# Patient Record
Sex: Female | Born: 1940 | Race: White | Hispanic: No | Marital: Single
Health system: Southern US, Community
[De-identification: ages and names within clinical notes are randomized; demographics above are authoritative.]

## PROBLEM LIST (undated history)

## (undated) DIAGNOSIS — F429 Obsessive-compulsive disorder, unspecified: Secondary | ICD-10-CM

## (undated) DIAGNOSIS — K5909 Other constipation: Secondary | ICD-10-CM

## (undated) DIAGNOSIS — I10 Essential (primary) hypertension: Secondary | ICD-10-CM

## (undated) DIAGNOSIS — E785 Hyperlipidemia, unspecified: Secondary | ICD-10-CM

## (undated) DIAGNOSIS — I509 Heart failure, unspecified: Secondary | ICD-10-CM

## (undated) DIAGNOSIS — E079 Disorder of thyroid, unspecified: Secondary | ICD-10-CM

## (undated) DIAGNOSIS — F419 Anxiety disorder, unspecified: Secondary | ICD-10-CM

## (undated) HISTORY — DX: Disorder of thyroid, unspecified: E07.9

## (undated) HISTORY — DX: Anxiety disorder, unspecified: F41.9

## (undated) HISTORY — DX: Hyperlipidemia, unspecified: E78.5

## (undated) HISTORY — DX: Heart failure, unspecified: I50.9

## (undated) HISTORY — DX: Essential (primary) hypertension: I10

## (undated) HISTORY — DX: Other constipation: K59.09

## (undated) HISTORY — DX: Obsessive-compulsive disorder, unspecified: F42.9

---

## 2004-04-17 ENCOUNTER — Other Ambulatory Visit: Payer: Self-pay

## 2004-04-17 ENCOUNTER — Emergency Department: Payer: Self-pay | Admitting: Emergency Medicine

## 2006-07-14 ENCOUNTER — Ambulatory Visit: Payer: Self-pay | Admitting: Specialist

## 2006-11-24 ENCOUNTER — Other Ambulatory Visit: Payer: Self-pay

## 2006-11-24 ENCOUNTER — Emergency Department: Payer: Self-pay | Admitting: Emergency Medicine

## 2007-08-15 ENCOUNTER — Observation Stay: Payer: Self-pay | Admitting: Cardiology

## 2008-09-24 ENCOUNTER — Inpatient Hospital Stay: Payer: Self-pay | Admitting: Internal Medicine

## 2009-01-08 ENCOUNTER — Inpatient Hospital Stay: Payer: Self-pay | Admitting: Internal Medicine

## 2009-02-14 ENCOUNTER — Inpatient Hospital Stay: Payer: Self-pay | Admitting: Surgery

## 2009-05-08 ENCOUNTER — Ambulatory Visit: Payer: Self-pay | Admitting: Gastroenterology

## 2009-12-01 ENCOUNTER — Ambulatory Visit: Payer: Self-pay | Admitting: Internal Medicine

## 2009-12-08 ENCOUNTER — Ambulatory Visit: Payer: Self-pay | Admitting: Internal Medicine

## 2010-01-01 ENCOUNTER — Ambulatory Visit: Payer: Self-pay | Admitting: Internal Medicine

## 2010-01-31 ENCOUNTER — Ambulatory Visit: Payer: Self-pay | Admitting: Internal Medicine

## 2010-03-03 ENCOUNTER — Ambulatory Visit: Payer: Self-pay | Admitting: Internal Medicine

## 2010-04-02 ENCOUNTER — Ambulatory Visit: Payer: Self-pay | Admitting: Internal Medicine

## 2010-05-03 ENCOUNTER — Ambulatory Visit: Payer: Self-pay | Admitting: Internal Medicine

## 2010-05-20 ENCOUNTER — Emergency Department: Payer: Self-pay | Admitting: Emergency Medicine

## 2010-06-03 ENCOUNTER — Ambulatory Visit: Payer: Self-pay | Admitting: Internal Medicine

## 2010-07-07 ENCOUNTER — Ambulatory Visit: Payer: Self-pay | Admitting: Internal Medicine

## 2010-08-02 ENCOUNTER — Ambulatory Visit: Payer: Self-pay | Admitting: Internal Medicine

## 2010-09-01 ENCOUNTER — Ambulatory Visit: Payer: Self-pay | Admitting: Internal Medicine

## 2010-10-02 ENCOUNTER — Ambulatory Visit: Payer: Self-pay | Admitting: Internal Medicine

## 2010-11-01 ENCOUNTER — Ambulatory Visit: Payer: Self-pay | Admitting: Internal Medicine

## 2010-12-02 ENCOUNTER — Ambulatory Visit: Payer: Self-pay | Admitting: Internal Medicine

## 2011-01-02 ENCOUNTER — Ambulatory Visit: Payer: Self-pay | Admitting: Internal Medicine

## 2011-01-29 ENCOUNTER — Emergency Department: Payer: Self-pay | Admitting: Emergency Medicine

## 2011-02-08 ENCOUNTER — Emergency Department: Payer: Self-pay | Admitting: Emergency Medicine

## 2011-02-16 ENCOUNTER — Ambulatory Visit: Payer: Self-pay | Admitting: Internal Medicine

## 2011-03-04 ENCOUNTER — Ambulatory Visit: Payer: Self-pay | Admitting: Internal Medicine

## 2011-04-01 ENCOUNTER — Emergency Department: Payer: Self-pay | Admitting: Internal Medicine

## 2011-05-12 ENCOUNTER — Ambulatory Visit: Payer: Self-pay | Admitting: Internal Medicine

## 2011-06-04 ENCOUNTER — Ambulatory Visit: Payer: Self-pay | Admitting: Internal Medicine

## 2011-06-09 LAB — IRON AND TIBC
Iron Bind.Cap.(Total): 367 ug/dL (ref 250–450)
Iron: 54 ug/dL (ref 50–170)
Unbound Iron-Bind.Cap.: 313 ug/dL

## 2011-06-09 LAB — CANCER CENTER HEMOGLOBIN: HGB: 12.1 g/dL (ref 12.0–16.0)

## 2011-07-02 ENCOUNTER — Ambulatory Visit: Payer: Self-pay | Admitting: Internal Medicine

## 2012-05-22 ENCOUNTER — Emergency Department: Payer: Self-pay | Admitting: Emergency Medicine

## 2012-05-22 LAB — URINALYSIS, COMPLETE
Bilirubin,UR: NEGATIVE
Blood: NEGATIVE
Glucose,UR: NEGATIVE mg/dL (ref 0–75)
Nitrite: POSITIVE
Ph: 5 (ref 4.5–8.0)
Protein: NEGATIVE
Squamous Epithelial: 3
WBC UR: 23 /HPF (ref 0–5)

## 2012-05-23 LAB — CBC
HGB: 11 g/dL — ABNORMAL LOW (ref 12.0–16.0)
MCH: 31.3 pg (ref 26.0–34.0)
MCHC: 33.2 g/dL (ref 32.0–36.0)
MCV: 94 fL (ref 80–100)
Platelet: 227 10*3/uL (ref 150–440)
RBC: 3.52 10*6/uL — ABNORMAL LOW (ref 3.80–5.20)
RDW: 14.4 % (ref 11.5–14.5)
WBC: 7.4 10*3/uL (ref 3.6–11.0)

## 2012-05-23 LAB — COMPREHENSIVE METABOLIC PANEL
Albumin: 3.6 g/dL (ref 3.4–5.0)
Alkaline Phosphatase: 107 U/L (ref 50–136)
Anion Gap: 7 (ref 7–16)
BUN: 16 mg/dL (ref 7–18)
Calcium, Total: 8.8 mg/dL (ref 8.5–10.1)
Chloride: 108 mmol/L — ABNORMAL HIGH (ref 98–107)
Creatinine: 1.06 mg/dL (ref 0.60–1.30)
EGFR (African American): >60
EGFR (Non-African Amer.): 52 — ABNORMAL LOW
Glucose: 91 mg/dL (ref 65–99)
Osmolality: 284 (ref 275–301)
Total Protein: 7 g/dL (ref 6.4–8.2)

## 2012-05-23 LAB — TROPONIN I: Troponin-I: 0.02 ng/mL

## 2012-05-23 LAB — LIPASE, BLOOD: Lipase: 193 U/L (ref 73–393)

## 2012-05-25 LAB — URINE CULTURE

## 2012-10-01 ENCOUNTER — Emergency Department: Payer: Self-pay | Admitting: Internal Medicine

## 2012-10-01 LAB — COMPREHENSIVE METABOLIC PANEL
Alkaline Phosphatase: 105 U/L (ref 50–136)
BUN: 25 mg/dL — ABNORMAL HIGH (ref 7–18)
Chloride: 109 mmol/L — ABNORMAL HIGH (ref 98–107)
Co2: 22 mmol/L (ref 21–32)
EGFR (African American): 35 — ABNORMAL LOW
EGFR (Non-African Amer.): 30 — ABNORMAL LOW
Glucose: 69 mg/dL (ref 65–99)
Osmolality: 280 (ref 275–301)
Potassium: 4 mmol/L (ref 3.5–5.1)
SGOT(AST): 30 U/L (ref 15–37)
SGPT (ALT): 18 U/L (ref 12–78)
Sodium: 139 mmol/L (ref 136–145)
Total Protein: 7.3 g/dL (ref 6.4–8.2)

## 2012-10-01 LAB — CBC WITH DIFFERENTIAL/PLATELET
Basophil #: 0 10*3/uL (ref 0.0–0.1)
Basophil %: 0.4 %
Eosinophil #: 0.1 10*3/uL (ref 0.0–0.7)
Eosinophil %: 1 %
HGB: 11.4 g/dL — ABNORMAL LOW (ref 12.0–16.0)
Lymphocyte #: 1.1 10*3/uL (ref 1.0–3.6)
Lymphocyte %: 11.8 %
MCV: 96 fL (ref 80–100)
Monocyte #: 0.5 x10 3/mm (ref 0.2–0.9)
Neutrophil #: 7.7 10*3/uL — ABNORMAL HIGH (ref 1.4–6.5)
Neutrophil %: 81.4 %
Platelet: 231 10*3/uL (ref 150–440)
RBC: 3.58 10*6/uL — ABNORMAL LOW (ref 3.80–5.20)
RDW: 15.9 % — ABNORMAL HIGH (ref 11.5–14.5)
WBC: 9.5 10*3/uL (ref 3.6–11.0)

## 2012-10-01 LAB — LIPASE, BLOOD: Lipase: 258 U/L (ref 73–393)

## 2012-10-02 ENCOUNTER — Observation Stay: Payer: Self-pay | Admitting: Internal Medicine

## 2012-10-02 LAB — URINALYSIS, COMPLETE
Ketone: NEGATIVE
Nitrite: POSITIVE
Protein: NEGATIVE
WBC UR: 100 /HPF (ref 0–5)

## 2012-10-02 LAB — COMPREHENSIVE METABOLIC PANEL
Albumin: 3.5 g/dL (ref 3.4–5.0)
Alkaline Phosphatase: 101 U/L (ref 50–136)
BUN: 24 mg/dL — ABNORMAL HIGH (ref 7–18)
Co2: 23 mmol/L (ref 21–32)
Creatinine: 1.46 mg/dL — ABNORMAL HIGH (ref 0.60–1.30)
EGFR (African American): 41 — ABNORMAL LOW
Glucose: 85 mg/dL (ref 65–99)
Potassium: 3.9 mmol/L (ref 3.5–5.1)
SGOT(AST): 26 U/L (ref 15–37)
SGPT (ALT): 17 U/L (ref 12–78)
Sodium: 137 mmol/L (ref 136–145)
Total Protein: 7 g/dL (ref 6.4–8.2)

## 2012-10-02 LAB — CBC
HCT: 31.7 % — ABNORMAL LOW (ref 35.0–47.0)
MCH: 32 pg (ref 26.0–34.0)
MCHC: 33.7 g/dL (ref 32.0–36.0)
MCV: 95 fL (ref 80–100)
Platelet: 198 10*3/uL (ref 150–440)
RDW: 15.5 % — ABNORMAL HIGH (ref 11.5–14.5)

## 2012-10-02 LAB — LIPASE, BLOOD: Lipase: 199 U/L (ref 73–393)

## 2012-10-03 LAB — BASIC METABOLIC PANEL
Calcium, Total: 8 mg/dL — ABNORMAL LOW (ref 8.5–10.1)
Chloride: 112 mmol/L — ABNORMAL HIGH (ref 98–107)
Co2: 22 mmol/L (ref 21–32)
Creatinine: 1.23 mg/dL (ref 0.60–1.30)
EGFR (African American): 51 — ABNORMAL LOW
Glucose: 71 mg/dL (ref 65–99)
Osmolality: 283 (ref 275–301)
Sodium: 141 mmol/L (ref 136–145)

## 2012-10-04 LAB — BASIC METABOLIC PANEL
BUN: 23 mg/dL — ABNORMAL HIGH (ref 7–18)
Calcium, Total: 8.6 mg/dL (ref 8.5–10.1)
Chloride: 107 mmol/L (ref 98–107)
Co2: 24 mmol/L (ref 21–32)
EGFR (African American): 42 — ABNORMAL LOW
EGFR (Non-African Amer.): 36 — ABNORMAL LOW
Glucose: 76 mg/dL (ref 65–99)
Osmolality: 280 (ref 275–301)
Sodium: 139 mmol/L (ref 136–145)

## 2013-06-08 ENCOUNTER — Ambulatory Visit: Payer: Self-pay | Admitting: Gastroenterology

## 2013-08-10 ENCOUNTER — Inpatient Hospital Stay: Payer: Self-pay

## 2013-08-10 LAB — TROPONIN I

## 2013-08-10 LAB — COMPREHENSIVE METABOLIC PANEL
ALBUMIN: 3.4 g/dL (ref 3.4–5.0)
AST: 17 U/L (ref 15–37)
Alkaline Phosphatase: 100 U/L
Anion Gap: 7 (ref 7–16)
BILIRUBIN TOTAL: 0.3 mg/dL (ref 0.2–1.0)
BUN: 15 mg/dL (ref 7–18)
CALCIUM: 8.6 mg/dL (ref 8.5–10.1)
CHLORIDE: 110 mmol/L — AB (ref 98–107)
CREATININE: 1.7 mg/dL — AB (ref 0.60–1.30)
Co2: 25 mmol/L (ref 21–32)
EGFR (African American): 34 — ABNORMAL LOW
GFR CALC NON AF AMER: 29 — AB
GLUCOSE: 128 mg/dL — AB (ref 65–99)
Osmolality: 286 (ref 275–301)
Potassium: 3.1 mmol/L — ABNORMAL LOW (ref 3.5–5.1)
SGPT (ALT): 16 U/L (ref 12–78)
SODIUM: 142 mmol/L (ref 136–145)
Total Protein: 6.6 g/dL (ref 6.4–8.2)

## 2013-08-10 LAB — CK TOTAL AND CKMB (NOT AT ARMC)
CK, Total: 59 U/L
CK-MB: 2 ng/mL (ref 0.5–3.6)

## 2013-08-10 LAB — CBC
HCT: 35.7 % (ref 35.0–47.0)
HGB: 11.8 g/dL — AB (ref 12.0–16.0)
MCH: 34.3 pg — ABNORMAL HIGH (ref 26.0–34.0)
MCHC: 33 g/dL (ref 32.0–36.0)
MCV: 104 fL — ABNORMAL HIGH (ref 80–100)
PLATELETS: 195 10*3/uL (ref 150–440)
RBC: 3.42 10*6/uL — ABNORMAL LOW (ref 3.80–5.20)
RDW: 12.9 % (ref 11.5–14.5)
WBC: 6.9 10*3/uL (ref 3.6–11.0)

## 2013-08-10 LAB — TSH: Thyroid Stimulating Horm: 1.15 u[IU]/mL

## 2013-08-10 LAB — PROTIME-INR
INR: 1
PROTHROMBIN TIME: 13.4 s (ref 11.5–14.7)

## 2013-08-10 LAB — MAGNESIUM: Magnesium: 1.1 mg/dL — ABNORMAL LOW

## 2013-08-10 LAB — LIPASE, BLOOD: Lipase: 261 U/L

## 2013-08-10 LAB — APTT: ACTIVATED PTT: 26.9 s (ref 23.6–35.9)

## 2013-08-11 LAB — TROPONIN I
Troponin-I: 0.02 ng/mL
Troponin-I: <0.02 ng/mL

## 2013-08-12 ENCOUNTER — Emergency Department: Payer: Self-pay | Admitting: Emergency Medicine

## 2013-08-12 LAB — CBC
HCT: 33.4 % — AB (ref 35.0–47.0)
HGB: 10.3 g/dL — ABNORMAL LOW (ref 12.0–16.0)
MCH: 32 pg (ref 26.0–34.0)
MCHC: 30.9 g/dL — AB (ref 32.0–36.0)
MCV: 104 fL — ABNORMAL HIGH (ref 80–100)
Platelet: 173 10*3/uL (ref 150–440)
RBC: 3.22 10*6/uL — ABNORMAL LOW (ref 3.80–5.20)
RDW: 12.7 % (ref 11.5–14.5)
WBC: 6.4 10*3/uL (ref 3.6–11.0)

## 2013-08-12 LAB — BASIC METABOLIC PANEL
Anion Gap: 4 — ABNORMAL LOW (ref 7–16)
BUN: 11 mg/dL (ref 7–18)
CHLORIDE: 113 mmol/L — AB (ref 98–107)
Calcium, Total: 7.9 mg/dL — ABNORMAL LOW (ref 8.5–10.1)
Co2: 24 mmol/L (ref 21–32)
Creatinine: 1.17 mg/dL (ref 0.60–1.30)
EGFR (Non-African Amer.): 46 — ABNORMAL LOW
GFR CALC AF AMER: 54 — AB
Glucose: 79 mg/dL (ref 65–99)
Osmolality: 280 (ref 275–301)
Potassium: 3.5 mmol/L (ref 3.5–5.1)
SODIUM: 141 mmol/L (ref 136–145)

## 2013-08-12 LAB — COMPREHENSIVE METABOLIC PANEL
ALBUMIN: 3.1 g/dL — AB (ref 3.4–5.0)
AST: 26 U/L (ref 15–37)
Alkaline Phosphatase: 92 U/L
Anion Gap: 5 — ABNORMAL LOW (ref 7–16)
BILIRUBIN TOTAL: 0.4 mg/dL (ref 0.2–1.0)
BUN: 12 mg/dL (ref 7–18)
CHLORIDE: 112 mmol/L — AB (ref 98–107)
Calcium, Total: 8.3 mg/dL — ABNORMAL LOW (ref 8.5–10.1)
Co2: 23 mmol/L (ref 21–32)
Creatinine: 1.37 mg/dL — ABNORMAL HIGH (ref 0.60–1.30)
EGFR (African American): 44 — ABNORMAL LOW
GFR CALC NON AF AMER: 38 — AB
Glucose: 129 mg/dL — ABNORMAL HIGH (ref 65–99)
OSMOLALITY: 281 (ref 275–301)
POTASSIUM: 3.4 mmol/L — AB (ref 3.5–5.1)
SGPT (ALT): 16 U/L (ref 12–78)
SODIUM: 140 mmol/L (ref 136–145)
TOTAL PROTEIN: 6.2 g/dL — AB (ref 6.4–8.2)

## 2013-08-12 LAB — CBC WITH DIFFERENTIAL/PLATELET
Basophil #: 0 10*3/uL (ref 0.0–0.1)
Basophil %: 0.5 %
EOS ABS: 0.1 10*3/uL (ref 0.0–0.7)
Eosinophil %: 1.8 %
HCT: 32.2 % — ABNORMAL LOW (ref 35.0–47.0)
HGB: 10.4 g/dL — ABNORMAL LOW (ref 12.0–16.0)
LYMPHS ABS: 1.2 10*3/uL (ref 1.0–3.6)
LYMPHS PCT: 19 %
MCH: 33.8 pg (ref 26.0–34.0)
MCHC: 32.3 g/dL (ref 32.0–36.0)
MCV: 105 fL — ABNORMAL HIGH (ref 80–100)
MONO ABS: 0.5 x10 3/mm (ref 0.2–0.9)
Monocyte %: 8.3 %
NEUTROS PCT: 70.4 %
Neutrophil #: 4.3 10*3/uL (ref 1.4–6.5)
Platelet: 164 10*3/uL (ref 150–440)
RBC: 3.08 10*6/uL — ABNORMAL LOW (ref 3.80–5.20)
RDW: 13 % (ref 11.5–14.5)
WBC: 6.1 10*3/uL (ref 3.6–11.0)

## 2013-08-12 LAB — MAGNESIUM: Magnesium: 1.5 mg/dL — ABNORMAL LOW

## 2013-08-12 LAB — LIPASE, BLOOD: Lipase: 152 U/L (ref 73–393)

## 2013-08-14 ENCOUNTER — Emergency Department: Payer: Self-pay | Admitting: Emergency Medicine

## 2013-08-14 LAB — COMPREHENSIVE METABOLIC PANEL
Albumin: 3.4 g/dL (ref 3.4–5.0)
Alkaline Phosphatase: 91 U/L
Anion Gap: 6 — ABNORMAL LOW (ref 7–16)
BUN: 11 mg/dL (ref 7–18)
Bilirubin,Total: 0.5 mg/dL (ref 0.2–1.0)
CALCIUM: 8.5 mg/dL (ref 8.5–10.1)
CREATININE: 1.29 mg/dL (ref 0.60–1.30)
Chloride: 111 mmol/L — ABNORMAL HIGH (ref 98–107)
Co2: 25 mmol/L (ref 21–32)
EGFR (Non-African Amer.): 41 — ABNORMAL LOW
GFR CALC AF AMER: 48 — AB
Glucose: 90 mg/dL (ref 65–99)
Osmolality: 282 (ref 275–301)
Potassium: 3.4 mmol/L — ABNORMAL LOW (ref 3.5–5.1)
SGOT(AST): 28 U/L (ref 15–37)
SGPT (ALT): 16 U/L (ref 12–78)
Sodium: 142 mmol/L (ref 136–145)
Total Protein: 6.5 g/dL (ref 6.4–8.2)

## 2013-08-14 LAB — CBC WITH DIFFERENTIAL/PLATELET
BASOS ABS: 0 10*3/uL (ref 0.0–0.1)
Basophil %: 0.3 %
Eosinophil #: 0 10*3/uL (ref 0.0–0.7)
Eosinophil %: 0.6 %
HCT: 35.9 % (ref 35.0–47.0)
HGB: 11.5 g/dL — ABNORMAL LOW (ref 12.0–16.0)
Lymphocyte #: 0.5 10*3/uL — ABNORMAL LOW (ref 1.0–3.6)
Lymphocyte %: 7.3 %
MCH: 33.3 pg (ref 26.0–34.0)
MCHC: 32.1 g/dL (ref 32.0–36.0)
MCV: 104 fL — ABNORMAL HIGH (ref 80–100)
MONO ABS: 0.3 x10 3/mm (ref 0.2–0.9)
Monocyte %: 4.9 %
Neutrophil #: 5.9 10*3/uL (ref 1.4–6.5)
Neutrophil %: 86.9 %
Platelet: 174 10*3/uL (ref 150–440)
RBC: 3.45 10*6/uL — ABNORMAL LOW (ref 3.80–5.20)
RDW: 12.9 % (ref 11.5–14.5)
WBC: 6.7 10*3/uL (ref 3.6–11.0)

## 2013-09-30 ENCOUNTER — Emergency Department: Payer: Self-pay | Admitting: Emergency Medicine

## 2013-09-30 LAB — COMPREHENSIVE METABOLIC PANEL
ALBUMIN: 3.1 g/dL — AB (ref 3.4–5.0)
ALK PHOS: 94 U/L
Anion Gap: 8 (ref 7–16)
BILIRUBIN TOTAL: 0.4 mg/dL (ref 0.2–1.0)
BUN: 12 mg/dL (ref 7–18)
CREATININE: 1.49 mg/dL — AB (ref 0.60–1.30)
Calcium, Total: 8.7 mg/dL (ref 8.5–10.1)
Chloride: 111 mmol/L — ABNORMAL HIGH (ref 98–107)
Co2: 22 mmol/L (ref 21–32)
EGFR (African American): 40 — ABNORMAL LOW
GFR CALC NON AF AMER: 34 — AB
GLUCOSE: 92 mg/dL (ref 65–99)
Osmolality: 281 (ref 275–301)
Potassium: 4.5 mmol/L (ref 3.5–5.1)
SGOT(AST): 36 U/L (ref 15–37)
SGPT (ALT): 18 U/L (ref 12–78)
Sodium: 141 mmol/L (ref 136–145)
TOTAL PROTEIN: 6.5 g/dL (ref 6.4–8.2)

## 2013-09-30 LAB — TROPONIN I

## 2013-09-30 LAB — CBC
HCT: 34.7 % — AB (ref 35.0–47.0)
HGB: 11.5 g/dL — ABNORMAL LOW (ref 12.0–16.0)
MCH: 35.3 pg — ABNORMAL HIGH (ref 26.0–34.0)
MCHC: 33.1 g/dL (ref 32.0–36.0)
MCV: 107 fL — ABNORMAL HIGH (ref 80–100)
PLATELETS: 184 10*3/uL (ref 150–440)
RBC: 3.25 10*6/uL — ABNORMAL LOW (ref 3.80–5.20)
RDW: 13.1 % (ref 11.5–14.5)
WBC: 8.4 10*3/uL (ref 3.6–11.0)

## 2013-09-30 LAB — LIPASE, BLOOD: LIPASE: 211 U/L (ref 73–393)

## 2013-09-30 LAB — PROTIME-INR
INR: 1.1
Prothrombin Time: 14.2 secs (ref 11.5–14.7)

## 2013-10-07 ENCOUNTER — Observation Stay: Payer: Self-pay | Admitting: Urology

## 2013-10-07 LAB — URINALYSIS, COMPLETE
BILIRUBIN, UR: NEGATIVE
Glucose,UR: NEGATIVE mg/dL (ref 0–75)
KETONE: NEGATIVE
NITRITE: NEGATIVE
PH: 5 (ref 4.5–8.0)
PROTEIN: NEGATIVE
RBC,UR: 3 /HPF (ref 0–5)
Specific Gravity: 1.004 (ref 1.003–1.030)
WBC UR: 10 /HPF (ref 0–5)

## 2013-10-07 LAB — CBC WITH DIFFERENTIAL/PLATELET
BASOS PCT: 0.2 %
Basophil #: 0 10*3/uL (ref 0.0–0.1)
Eosinophil #: 0.1 10*3/uL (ref 0.0–0.7)
Eosinophil %: 0.9 %
HCT: 33.1 % — ABNORMAL LOW (ref 35.0–47.0)
HGB: 10.6 g/dL — ABNORMAL LOW (ref 12.0–16.0)
LYMPHS PCT: 7.4 %
Lymphocyte #: 0.5 10*3/uL — ABNORMAL LOW (ref 1.0–3.6)
MCH: 34.2 pg — ABNORMAL HIGH (ref 26.0–34.0)
MCHC: 32 g/dL (ref 32.0–36.0)
MCV: 107 fL — AB (ref 80–100)
MONO ABS: 0.4 x10 3/mm (ref 0.2–0.9)
Monocyte %: 6.6 %
NEUTROS PCT: 84.9 %
Neutrophil #: 5.7 10*3/uL (ref 1.4–6.5)
Platelet: 182 10*3/uL (ref 150–440)
RBC: 3.1 10*6/uL — ABNORMAL LOW (ref 3.80–5.20)
RDW: 13 % (ref 11.5–14.5)
WBC: 6.7 10*3/uL (ref 3.6–11.0)

## 2013-10-07 LAB — BASIC METABOLIC PANEL
ANION GAP: 8 (ref 7–16)
BUN: 15 mg/dL (ref 7–18)
CO2: 20 mmol/L — AB (ref 21–32)
CREATININE: 1.97 mg/dL — AB (ref 0.60–1.30)
Calcium, Total: 8.1 mg/dL — ABNORMAL LOW (ref 8.5–10.1)
Chloride: 109 mmol/L — ABNORMAL HIGH (ref 98–107)
EGFR (African American): 29 — ABNORMAL LOW
EGFR (Non-African Amer.): 25 — ABNORMAL LOW
GLUCOSE: 121 mg/dL — AB (ref 65–99)
OSMOLALITY: 276 (ref 275–301)
POTASSIUM: 3.9 mmol/L (ref 3.5–5.1)
SODIUM: 137 mmol/L (ref 136–145)

## 2013-10-08 LAB — URINE CULTURE

## 2013-10-11 ENCOUNTER — Emergency Department: Payer: Self-pay | Admitting: Emergency Medicine

## 2013-10-12 ENCOUNTER — Emergency Department: Payer: Self-pay | Admitting: Emergency Medicine

## 2013-10-12 LAB — CBC WITH DIFFERENTIAL/PLATELET
BASOS ABS: 0 10*3/uL (ref 0.0–0.1)
BASOS PCT: 0.3 %
EOS PCT: 2.4 %
Eosinophil #: 0.1 10*3/uL (ref 0.0–0.7)
HCT: 31.7 % — ABNORMAL LOW (ref 35.0–47.0)
HGB: 10.4 g/dL — ABNORMAL LOW (ref 12.0–16.0)
LYMPHS ABS: 1.2 10*3/uL (ref 1.0–3.6)
LYMPHS PCT: 20 %
MCH: 35.1 pg — AB (ref 26.0–34.0)
MCHC: 32.8 g/dL (ref 32.0–36.0)
MCV: 107 fL — AB (ref 80–100)
MONO ABS: 0.5 x10 3/mm (ref 0.2–0.9)
Monocyte %: 8.4 %
NEUTROS ABS: 4 10*3/uL (ref 1.4–6.5)
Neutrophil %: 68.9 %
PLATELETS: 171 10*3/uL (ref 150–440)
RBC: 2.96 10*6/uL — AB (ref 3.80–5.20)
RDW: 13.3 % (ref 11.5–14.5)
WBC: 5.9 10*3/uL (ref 3.6–11.0)

## 2013-10-12 LAB — COMPREHENSIVE METABOLIC PANEL
ALK PHOS: 65 U/L
ANION GAP: 5 — AB (ref 7–16)
Albumin: 3.1 g/dL — ABNORMAL LOW (ref 3.4–5.0)
BUN: 19 mg/dL — AB (ref 7–18)
Bilirubin,Total: 0.2 mg/dL (ref 0.2–1.0)
CHLORIDE: 111 mmol/L — AB (ref 98–107)
Calcium, Total: 8.3 mg/dL — ABNORMAL LOW (ref 8.5–10.1)
Co2: 26 mmol/L (ref 21–32)
Creatinine: 1.78 mg/dL — ABNORMAL HIGH (ref 0.60–1.30)
EGFR (Non-African Amer.): 28 — ABNORMAL LOW
GFR CALC AF AMER: 32 — AB
Glucose: 85 mg/dL (ref 65–99)
OSMOLALITY: 285 (ref 275–301)
POTASSIUM: 3.7 mmol/L (ref 3.5–5.1)
SGOT(AST): 39 U/L — ABNORMAL HIGH (ref 15–37)
SGPT (ALT): 25 U/L (ref 12–78)
Sodium: 142 mmol/L (ref 136–145)
Total Protein: 6.1 g/dL — ABNORMAL LOW (ref 6.4–8.2)

## 2013-10-12 LAB — LIPASE, BLOOD: LIPASE: 172 U/L (ref 73–393)

## 2013-10-12 LAB — CLOSTRIDIUM DIFFICILE(ARMC)

## 2013-10-13 LAB — COMPREHENSIVE METABOLIC PANEL
ANION GAP: 6 — AB (ref 7–16)
AST: 34 U/L (ref 15–37)
Albumin: 3.4 g/dL (ref 3.4–5.0)
Alkaline Phosphatase: 74 U/L
BUN: 14 mg/dL (ref 7–18)
Bilirubin,Total: 0.4 mg/dL (ref 0.2–1.0)
CALCIUM: 8.6 mg/dL (ref 8.5–10.1)
CO2: 27 mmol/L (ref 21–32)
Chloride: 109 mmol/L — ABNORMAL HIGH (ref 98–107)
Creatinine: 1.4 mg/dL — ABNORMAL HIGH (ref 0.60–1.30)
EGFR (African American): 43 — ABNORMAL LOW
GFR CALC NON AF AMER: 37 — AB
GLUCOSE: 89 mg/dL (ref 65–99)
OSMOLALITY: 283 (ref 275–301)
Potassium: 3.6 mmol/L (ref 3.5–5.1)
SGPT (ALT): 20 U/L (ref 12–78)
Sodium: 142 mmol/L (ref 136–145)
Total Protein: 6.5 g/dL (ref 6.4–8.2)

## 2013-10-13 LAB — CBC
HCT: 34.1 % — AB (ref 35.0–47.0)
HGB: 11.3 g/dL — AB (ref 12.0–16.0)
MCH: 35.4 pg — ABNORMAL HIGH (ref 26.0–34.0)
MCHC: 33.3 g/dL (ref 32.0–36.0)
MCV: 106 fL — AB (ref 80–100)
Platelet: 200 10*3/uL (ref 150–440)
RBC: 3.21 10*6/uL — AB (ref 3.80–5.20)
RDW: 12.9 % (ref 11.5–14.5)
WBC: 7.2 10*3/uL (ref 3.6–11.0)

## 2013-10-13 LAB — LIPASE, BLOOD: Lipase: 211 U/L (ref 73–393)

## 2013-10-14 ENCOUNTER — Emergency Department: Payer: Self-pay | Admitting: Emergency Medicine

## 2013-10-14 LAB — URINALYSIS, COMPLETE
BILIRUBIN, UR: NEGATIVE
Bacteria: NONE SEEN
Glucose,UR: NEGATIVE mg/dL (ref 0–75)
Ketone: NEGATIVE
NITRITE: NEGATIVE
PH: 5 (ref 4.5–8.0)
RBC,UR: 76 /HPF (ref 0–5)
Specific Gravity: 1.013 (ref 1.003–1.030)

## 2013-10-14 LAB — COMPREHENSIVE METABOLIC PANEL
ALK PHOS: 66 U/L
ANION GAP: 7 (ref 7–16)
AST: 22 U/L (ref 15–37)
Albumin: 3.2 g/dL — ABNORMAL LOW (ref 3.4–5.0)
BILIRUBIN TOTAL: 0.4 mg/dL (ref 0.2–1.0)
BUN: 11 mg/dL (ref 7–18)
CALCIUM: 8.4 mg/dL — AB (ref 8.5–10.1)
CO2: 25 mmol/L (ref 21–32)
CREATININE: 1.21 mg/dL (ref 0.60–1.30)
Chloride: 111 mmol/L — ABNORMAL HIGH (ref 98–107)
EGFR (Non-African Amer.): 44 — ABNORMAL LOW
GFR CALC AF AMER: 51 — AB
Glucose: 102 mg/dL — ABNORMAL HIGH (ref 65–99)
OSMOLALITY: 285 (ref 275–301)
Potassium: 3.2 mmol/L — ABNORMAL LOW (ref 3.5–5.1)
SGPT (ALT): 21 U/L (ref 12–78)
SODIUM: 143 mmol/L (ref 136–145)
Total Protein: 6.1 g/dL — ABNORMAL LOW (ref 6.4–8.2)

## 2013-10-14 LAB — CBC
HCT: 33.1 % — AB (ref 35.0–47.0)
HGB: 10.8 g/dL — ABNORMAL LOW (ref 12.0–16.0)
MCH: 34.6 pg — ABNORMAL HIGH (ref 26.0–34.0)
MCHC: 32.5 g/dL (ref 32.0–36.0)
MCV: 107 fL — ABNORMAL HIGH (ref 80–100)
Platelet: 193 10*3/uL (ref 150–440)
RBC: 3.11 10*6/uL — ABNORMAL LOW (ref 3.80–5.20)
RDW: 13.1 % (ref 11.5–14.5)
WBC: 5.7 10*3/uL (ref 3.6–11.0)

## 2013-10-14 LAB — CK TOTAL AND CKMB (NOT AT ARMC)
CK, Total: 78 U/L
CK-MB: 2.3 ng/mL (ref 0.5–3.6)

## 2013-10-14 LAB — LIPASE, BLOOD: Lipase: 209 U/L (ref 73–393)

## 2013-10-15 ENCOUNTER — Emergency Department: Payer: Self-pay | Admitting: Emergency Medicine

## 2013-10-15 LAB — URINALYSIS, COMPLETE
BILIRUBIN, UR: NEGATIVE
GLUCOSE, UR: NEGATIVE mg/dL (ref 0–75)
KETONE: NEGATIVE
NITRITE: NEGATIVE
PH: 5 (ref 4.5–8.0)
RBC,UR: 63 /HPF (ref 0–5)
SPECIFIC GRAVITY: 1.012 (ref 1.003–1.030)
WBC UR: 25 /HPF (ref 0–5)

## 2013-10-15 LAB — COMPREHENSIVE METABOLIC PANEL
AST: 25 U/L (ref 15–37)
Albumin: 3 g/dL — ABNORMAL LOW (ref 3.4–5.0)
Alkaline Phosphatase: 66 U/L
Anion Gap: 10 (ref 7–16)
BUN: 10 mg/dL (ref 7–18)
Bilirubin,Total: 0.3 mg/dL (ref 0.2–1.0)
CO2: 24 mmol/L (ref 21–32)
CREATININE: 1.17 mg/dL (ref 0.60–1.30)
Calcium, Total: 8.3 mg/dL — ABNORMAL LOW (ref 8.5–10.1)
Chloride: 106 mmol/L (ref 98–107)
GFR CALC AF AMER: 54 — AB
GFR CALC NON AF AMER: 46 — AB
Glucose: 125 mg/dL — ABNORMAL HIGH (ref 65–99)
Osmolality: 280 (ref 275–301)
Potassium: 3.2 mmol/L — ABNORMAL LOW (ref 3.5–5.1)
SGPT (ALT): 20 U/L (ref 12–78)
Sodium: 140 mmol/L (ref 136–145)
Total Protein: 5.9 g/dL — ABNORMAL LOW (ref 6.4–8.2)

## 2013-10-15 LAB — CBC
HCT: 32.9 % — AB (ref 35.0–47.0)
HGB: 10.6 g/dL — AB (ref 12.0–16.0)
MCH: 34.3 pg — AB (ref 26.0–34.0)
MCHC: 32.3 g/dL (ref 32.0–36.0)
MCV: 106 fL — ABNORMAL HIGH (ref 80–100)
Platelet: 178 10*3/uL (ref 150–440)
RBC: 3.09 10*6/uL — AB (ref 3.80–5.20)
RDW: 13 % (ref 11.5–14.5)
WBC: 6.7 10*3/uL (ref 3.6–11.0)

## 2013-10-15 LAB — LIPASE, BLOOD: LIPASE: 204 U/L (ref 73–393)

## 2013-10-15 LAB — TROPONIN I: Troponin-I: 0.03 ng/mL

## 2013-10-16 LAB — URINE CULTURE

## 2013-10-18 LAB — TROPONIN I: Troponin-I: 0.03 ng/mL

## 2013-11-18 ENCOUNTER — Emergency Department: Payer: Self-pay | Admitting: Emergency Medicine

## 2013-11-18 LAB — COMPREHENSIVE METABOLIC PANEL
Albumin: 3.5 g/dL (ref 3.4–5.0)
Alkaline Phosphatase: 79 U/L
Anion Gap: 6 — ABNORMAL LOW (ref 7–16)
BUN: 19 mg/dL — ABNORMAL HIGH (ref 7–18)
Bilirubin,Total: 0.4 mg/dL (ref 0.2–1.0)
CHLORIDE: 100 mmol/L (ref 98–107)
CREATININE: 1.46 mg/dL — AB (ref 0.60–1.30)
Calcium, Total: 8.7 mg/dL (ref 8.5–10.1)
Co2: 27 mmol/L (ref 21–32)
EGFR (African American): 41 — ABNORMAL LOW
GFR CALC NON AF AMER: 35 — AB
Glucose: 96 mg/dL (ref 65–99)
Osmolality: 268 (ref 275–301)
Potassium: 4.1 mmol/L (ref 3.5–5.1)
SGOT(AST): 25 U/L (ref 15–37)
SGPT (ALT): 20 U/L (ref 12–78)
Sodium: 133 mmol/L — ABNORMAL LOW (ref 136–145)
TOTAL PROTEIN: 6.7 g/dL (ref 6.4–8.2)

## 2013-11-18 LAB — URINALYSIS, COMPLETE
BLOOD: NEGATIVE
Bacteria: NONE SEEN
Bilirubin,UR: NEGATIVE
Glucose,UR: NEGATIVE mg/dL (ref 0–75)
Ketone: NEGATIVE
Leukocyte Esterase: NEGATIVE
Nitrite: NEGATIVE
Ph: 6 (ref 4.5–8.0)
Protein: NEGATIVE
RBC,UR: <1 /HPF (ref 0–5)
Specific Gravity: 1.004 (ref 1.003–1.030)
Squamous Epithelial: NONE SEEN

## 2013-11-18 LAB — CBC
HCT: 34.3 % — ABNORMAL LOW (ref 35.0–47.0)
HGB: 11.5 g/dL — AB (ref 12.0–16.0)
MCH: 35.4 pg — ABNORMAL HIGH (ref 26.0–34.0)
MCHC: 33.7 g/dL (ref 32.0–36.0)
MCV: 105 fL — ABNORMAL HIGH (ref 80–100)
PLATELETS: 195 10*3/uL (ref 150–440)
RBC: 3.26 10*6/uL — ABNORMAL LOW (ref 3.80–5.20)
RDW: 12 % (ref 11.5–14.5)
WBC: 6.1 10*3/uL (ref 3.6–11.0)

## 2013-11-18 LAB — TROPONIN I: Troponin-I: 0.02 ng/mL

## 2013-11-18 LAB — LIPASE, BLOOD: Lipase: 203 U/L (ref 73–393)

## 2013-11-23 ENCOUNTER — Emergency Department: Payer: Self-pay | Admitting: Emergency Medicine

## 2013-11-23 LAB — COMPREHENSIVE METABOLIC PANEL
ALBUMIN: 3.1 g/dL — AB (ref 3.4–5.0)
ANION GAP: 6 — AB (ref 7–16)
AST: 27 U/L (ref 15–37)
Alkaline Phosphatase: 78 U/L
BUN: 29 mg/dL — ABNORMAL HIGH (ref 7–18)
Bilirubin,Total: 0.9 mg/dL (ref 0.2–1.0)
CREATININE: 1.81 mg/dL — AB (ref 0.60–1.30)
Calcium, Total: 8.7 mg/dL (ref 8.5–10.1)
Chloride: 99 mmol/L (ref 98–107)
Co2: 28 mmol/L (ref 21–32)
EGFR (African American): 32 — ABNORMAL LOW
EGFR (Non-African Amer.): 27 — ABNORMAL LOW
GLUCOSE: 122 mg/dL — AB (ref 65–99)
OSMOLALITY: 274 (ref 275–301)
Potassium: 4.4 mmol/L (ref 3.5–5.1)
SGPT (ALT): 20 U/L
Sodium: 133 mmol/L — ABNORMAL LOW (ref 136–145)
TOTAL PROTEIN: 6.8 g/dL (ref 6.4–8.2)

## 2013-11-23 LAB — CBC
HCT: 36.5 % (ref 35.0–47.0)
HGB: 11.6 g/dL — ABNORMAL LOW (ref 12.0–16.0)
MCH: 33.8 pg (ref 26.0–34.0)
MCHC: 31.8 g/dL — ABNORMAL LOW (ref 32.0–36.0)
MCV: 106 fL — ABNORMAL HIGH (ref 80–100)
PLATELETS: 181 10*3/uL (ref 150–440)
RBC: 3.43 10*6/uL — ABNORMAL LOW (ref 3.80–5.20)
RDW: 12.6 % (ref 11.5–14.5)
WBC: 20.2 10*3/uL — AB (ref 3.6–11.0)

## 2013-11-23 LAB — URINALYSIS, COMPLETE
Bilirubin,UR: NEGATIVE
GLUCOSE, UR: NEGATIVE mg/dL (ref 0–75)
Ketone: NEGATIVE
Nitrite: POSITIVE
Ph: 5 (ref 4.5–8.0)
Protein: 30
RBC,UR: 3 /HPF (ref 0–5)
SPECIFIC GRAVITY: 1.009 (ref 1.003–1.030)
Squamous Epithelial: 3
WBC UR: 489 /HPF (ref 0–5)

## 2013-11-23 LAB — TROPONIN I: Troponin-I: <0.02 ng/mL

## 2013-11-26 LAB — URINE CULTURE

## 2013-11-29 ENCOUNTER — Ambulatory Visit (INDEPENDENT_AMBULATORY_CARE_PROVIDER_SITE_OTHER): Payer: Commercial Managed Care - HMO | Admitting: Adult Health

## 2013-11-29 ENCOUNTER — Encounter: Payer: Self-pay | Admitting: Adult Health

## 2013-11-29 ENCOUNTER — Encounter (INDEPENDENT_AMBULATORY_CARE_PROVIDER_SITE_OTHER): Payer: Self-pay

## 2013-11-29 DIAGNOSIS — F429 Obsessive-compulsive disorder, unspecified: Secondary | ICD-10-CM

## 2013-11-29 MED ORDER — BUSPIRONE HCL 10 MG PO TABS: 10.0000 mg | ORAL_TABLET | Freq: Three times a day (TID) | ORAL | Status: AC

## 2013-11-29 MED ORDER — ACETAMINOPHEN 325 MG PO TABS: 325.0000 mg | ORAL_TABLET | Freq: Four times a day (QID) | ORAL | Status: AC | PRN

## 2013-11-29 MED ORDER — PHENAZOPYRIDINE HCL 200 MG PO TABS: 200.0000 mg | ORAL_TABLET | Freq: Three times a day (TID) | ORAL | Status: AC | PRN

## 2013-11-29 MED ORDER — LORAZEPAM 0.5 MG PO TABS: 0.5000 mg | ORAL_TABLET | Freq: Every evening | ORAL | Status: AC | PRN

## 2013-11-29 MED ORDER — PROMETHAZINE HCL 12.5 MG PO TABS: 12.5000 mg | ORAL_TABLET | Freq: Four times a day (QID) | ORAL | Status: AC | PRN

## 2013-11-29 MED ORDER — LORAZEPAM 0.5 MG PO TABS: 0.5000 mg | ORAL_TABLET | Freq: Two times a day (BID) | ORAL | Status: DC | PRN

## 2013-11-29 MED ORDER — FLUVOXAMINE MALEATE 50 MG PO TABS: 50.0000 mg | ORAL_TABLET | Freq: Every day | ORAL | Status: AC

## 2013-11-29 NOTE — Progress Notes (Signed)
Pre visit review using our clinic review tool, if applicable. No additional management support is needed unless otherwise documented below in the visit note. 

## 2013-11-30 ENCOUNTER — Encounter: Payer: Self-pay | Admitting: Adult Health

## 2013-11-30 ENCOUNTER — Emergency Department: Payer: Self-pay | Admitting: Emergency Medicine

## 2013-11-30 LAB — URINALYSIS, COMPLETE
BACTERIA: NONE SEEN
Bilirubin,UR: NEGATIVE
GLUCOSE, UR: NEGATIVE mg/dL (ref 0–75)
KETONE: NEGATIVE
LEUKOCYTE ESTERASE: NEGATIVE
Nitrite: NEGATIVE
PH: 5 (ref 4.5–8.0)
PROTEIN: NEGATIVE
RBC,UR: 3 /HPF (ref 0–5)
SPECIFIC GRAVITY: 1.009 (ref 1.003–1.030)
Squamous Epithelial: 1
WBC UR: 3 /HPF (ref 0–5)

## 2013-11-30 LAB — BASIC METABOLIC PANEL
ANION GAP: 8 (ref 7–16)
BUN: 26 mg/dL — ABNORMAL HIGH (ref 7–18)
Calcium, Total: 8.4 mg/dL — ABNORMAL LOW (ref 8.5–10.1)
Chloride: 100 mmol/L (ref 98–107)
Co2: 26 mmol/L (ref 21–32)
Creatinine: 1.49 mg/dL — ABNORMAL HIGH (ref 0.60–1.30)
EGFR (African American): 40 — ABNORMAL LOW
GFR CALC NON AF AMER: 34 — AB
Glucose: 118 mg/dL — ABNORMAL HIGH (ref 65–99)
Osmolality: 274 (ref 275–301)
Potassium: 4.3 mmol/L (ref 3.5–5.1)
SODIUM: 134 mmol/L — AB (ref 136–145)

## 2013-11-30 LAB — CBC
HCT: 33.1 % — ABNORMAL LOW (ref 35.0–47.0)
HGB: 11.2 g/dL — ABNORMAL LOW (ref 12.0–16.0)
MCH: 35 pg — AB (ref 26.0–34.0)
MCHC: 33.8 g/dL (ref 32.0–36.0)
MCV: 104 fL — ABNORMAL HIGH (ref 80–100)
Platelet: 169 10*3/uL (ref 150–440)
RBC: 3.19 10*6/uL — AB (ref 3.80–5.20)
RDW: 12.5 % (ref 11.5–14.5)
WBC: 6.8 10*3/uL (ref 3.6–11.0)

## 2013-11-30 LAB — TROPONIN I: Troponin-I: <0.02 ng/mL

## 2013-11-30 LAB — CK TOTAL AND CKMB (NOT AT ARMC)
CK, Total: 61 U/L
CK-MB: 1.8 ng/mL (ref 0.5–3.6)

## 2013-11-30 NOTE — Progress Notes (Signed)
Patient ID: Sharon Weiss, female   DOB: 04/25/1941, 73 y.o.   MRN: [id number]   Subjective:    Patient ID: Sharon Weiss, female    DOB: 04/25/1941, 73 y.o.   MRN: [id number]  HPI  Sharon Weiss presents to clinic with her caregive. Sharon Weiss is here to establish care. Recently hospitalized for UTI. She is living in an adult care home called [facility]. Hx of OCD started on Fluvoxamine and Respiradone in the hospital but no referral to Psych. Pt is very fearful, anxious. Cries and whimpering. Constantly wanting to go to the bathroom. Caregiver reports that patient is obsessed with bowels. She is hallucinating. Sees animals on her walker.  Caregiver has brought in FL-2 to be filled out. Pt needs several medications refilled and other meds discontinues that she is no longer taking. Hospital only provided 1 script for the OCD meds and, as mentioned, no referral to psych.  Caregiver has not filled out pt history information and pt is a poor historian.   Review of Systems  Unable to perform ROS Gastrointestinal:       Constantly going to the bathroom for BM  Psychiatric/Behavioral: Positive for hallucinations, behavioral problems, confusion, sleep disturbance and agitation. The patient is nervous/anxious.        Per caregiver report       Objective:  There were no vitals taken for this visit.   Physical Exam  Constitutional:  Pt is whimpering and crying during visit. "wants another room because the one we are in does not feel right"   HENT:  Head: Normocephalic and atraumatic.  Eyes: Conjunctivae and EOM are normal.  Cardiovascular: Normal rate and regular rhythm.   Pulmonary/Chest: Effort normal. No respiratory distress.  Abdominal:  Large panus   Musculoskeletal:  Ambulates with walker without incident  Neurological: She is alert.  Skin: Skin is warm and dry.  Psychiatric: She has a normal mood and affect. Her behavior is normal. Judgment and thought content normal.        Assessment & Plan:   1. OCD (obsessive compulsive disorder) I am referring pt to Psychiatry and have listed this as urgent. I have refilled medications she was prescribed in the hospital. Also refilled lorazepam because pt has been on this and takes every night.   - Ambulatory referral to Psychiatry  Note: greater than 60 min spent in face to face communication in reviewing patients records, medications. There was no discharge summary from the hospital. Pt was very nervous and made visit difficult and longer.

## 2013-12-01 LAB — COMPREHENSIVE METABOLIC PANEL
ALT: 19 U/L
Albumin: 3 g/dL — ABNORMAL LOW (ref 3.4–5.0)
Alkaline Phosphatase: 70 U/L
Anion Gap: 9 (ref 7–16)
BUN: 30 mg/dL — AB (ref 7–18)
Bilirubin,Total: 0.3 mg/dL (ref 0.2–1.0)
CALCIUM: 8.3 mg/dL — AB (ref 8.5–10.1)
CHLORIDE: 104 mmol/L (ref 98–107)
Co2: 27 mmol/L (ref 21–32)
Creatinine: 1.45 mg/dL — ABNORMAL HIGH (ref 0.60–1.30)
EGFR (Non-African Amer.): 36 — ABNORMAL LOW
GFR CALC AF AMER: 41 — AB
Glucose: 125 mg/dL — ABNORMAL HIGH (ref 65–99)
Osmolality: 287 (ref 275–301)
POTASSIUM: 4.6 mmol/L (ref 3.5–5.1)
SGOT(AST): 17 U/L (ref 15–37)
SODIUM: 140 mmol/L (ref 136–145)
TOTAL PROTEIN: 6.3 g/dL — AB (ref 6.4–8.2)

## 2013-12-01 LAB — DRUG SCREEN, URINE
AMPHETAMINES, UR SCREEN: NEGATIVE (ref ?–1000)
Barbiturates, Ur Screen: NEGATIVE (ref ?–200)
Benzodiazepine, Ur Scrn: NEGATIVE (ref ?–200)
CANNABINOID 50 NG, UR ~~LOC~~: NEGATIVE (ref ?–50)
Cocaine Metabolite,Ur ~~LOC~~: NEGATIVE (ref ?–300)
MDMA (ECSTASY) UR SCREEN: NEGATIVE (ref ?–500)
METHADONE, UR SCREEN: NEGATIVE (ref ?–300)
Opiate, Ur Screen: NEGATIVE (ref ?–300)
Phencyclidine (PCP) Ur S: NEGATIVE (ref ?–25)
TRICYCLIC, UR SCREEN: NEGATIVE (ref ?–1000)

## 2013-12-01 LAB — CBC WITH DIFFERENTIAL/PLATELET
Basophil #: 0.1 10*3/uL (ref 0.0–0.1)
Basophil %: 0.6 %
Eosinophil #: 0.1 10*3/uL (ref 0.0–0.7)
Eosinophil %: 0.9 %
HCT: 34.6 % — ABNORMAL LOW (ref 35.0–47.0)
HGB: 11.7 g/dL — AB (ref 12.0–16.0)
Lymphocyte #: 0.9 10*3/uL — ABNORMAL LOW (ref 1.0–3.6)
Lymphocyte %: 11.4 %
MCH: 35.1 pg — AB (ref 26.0–34.0)
MCHC: 33.9 g/dL (ref 32.0–36.0)
MCV: 104 fL — AB (ref 80–100)
Monocyte #: 0.6 x10 3/mm (ref 0.2–0.9)
Monocyte %: 8.1 %
Neutrophil #: 6.2 10*3/uL (ref 1.4–6.5)
Neutrophil %: 79 %
Platelet: 187 10*3/uL (ref 150–440)
RBC: 3.34 10*6/uL — AB (ref 3.80–5.20)
RDW: 12.9 % (ref 11.5–14.5)
WBC: 7.9 10*3/uL (ref 3.6–11.0)

## 2013-12-03 ENCOUNTER — Ambulatory Visit: Payer: Medicare Other

## 2013-12-12 ENCOUNTER — Ambulatory Visit: Payer: Self-pay | Admitting: Adult Health

## 2014-03-04 ENCOUNTER — Ambulatory Visit: Payer: Medicare Other | Admitting: Internal Medicine

## 2014-08-23 NOTE — Discharge Summary (Signed)
PATIENT NAME:  Sharon Weiss, Sharon Weiss MR#:  [id number] DATE OF BIRTH:  1941-04-08  DATE OF ADMISSION:  10/02/2012 DATE OF DISCHARGE:  10/04/2012  ADMITTING DIAGNOSIS: Acute pyelonephritis.   DISCHARGE DIAGNOSES:  1.  Abdominal pain of unclear etiology at this time, likely urinary tract infection, questionable pyelonephritis-related.  Urine culture is gram-negative rods, identification to follow.   2.  Acute renal failure, resolved on intravenous fluids.  3.  Anxiety. 4.  Constipation.  5.  History of gastrointestinal bleed. 6.  Diverticulosis.  7.  Anemia of chronic disease. 8.  Gastroesophageal reflux disease.  9.  Chronic obstructive pulmonary disease.  10.  Coronary artery disease.  11.  Diabetes mellitus.  12.  Hypertension.  13.  Hypothyroidism.   DISCHARGE CONDITION: Stable.   DISCHARGE MEDICATIONS: The patient is to resume her outpatient medications which are: 1.  Bentyl 20 mg p.o. 4 times daily as needed.  2.  Ranitidine 150 p.o. twice daily.  3.  AcipHex 20 mg p.o. once daily. 4.  Levothroid  125 mcg 1/2 tablet once daily.  5.  Lisinopril 5 mg p.o. daily. 6.  Toprol-XL 50 mg 1/2 tablet every 12 hours.  7.  Chlorpropamide 250 mg p.o. 1 tablet in the morning and 1/2 tablet, which would be 125 mg once at bedtime.  8.  Oxazepam 15 mg p.o. at bedtime.  9.  Colace 100 mg p.o. 2 capsules once daily at bedtime.  10.  Ex-Lax 3 tablets daily at bedtime.  11.  Nitroglycerin 0.4 mg sublingual tablet 1 tablet every 5 minutes x 3 as needed.  12.  Gaviscon Extra Strength liquid 10 mL 4 times daily as needed.  13.  Buspirone 10 mg twice daily.  14.  Tylenol 500 mg tablets, 2 tablets every 4 to 6 hours as needed.  15.  Flovent 2 puffs twice daily.  16.  Promethazine 25 mg p.o. at bedtime and every 6 hours as needed.  17.  Lorazepam 0.5 mg 3 times daily as needed.  18.  Lactulose 30 mg 2 to 3 tablets in the morning.  19.  Tramadol 50 mg p.o. every 4 to 6 hours as needed.  20.  Zantac 150  mg p.o. twice daily.  21.  Bentyl 20 mg p.o. 4 times daily as needed.  22.  MiraLax 17 grams orally daily.  23.  A new medication, ciprofloxacin 250 mg p.o. twice daily for 7 more days.   NOTE: The patient is not to take Lasix until recommended by primary care physician.   HOME OXYGEN: None.   DIET: 2 grams salt, low fat, low cholesterol, carbohydrate-controlled diet, mechanical soft.   ACTIVITY LIMITATIONS: As tolerated.   FOLLOW-UP APPOINTMENT: With Dr. Meredeth IdeFleming in 2 days after discharge.   CONSULTANTS: Psychiatrist, Dr. Toni Amendlapacs; Care Management.   RADIOLOGIC STUDIES: None.   LABORATORY DATA:  On arrival to the hospital revealed mildly elevated BUN and creatinine 24 and 1.46, otherwise BMP was unremarkable. Lipase level was normal at 199. The patient's liver enzymes were normal. The patient's CBC was within normal limits with white blood cell count 7.2, hemoglobin 10.7, platelet count 198. Urinalysis revealed yellow cloudy urine, negative for glucose, bilirubin or ketones. Specific gravity 1.009, pH was 5.0, 1+ blood, negative for protein, positive for nitrites, 3+ leukocytes esterase, 2 red blood cells, 100 white blood cells, 3+ bacteria, 14 epithelial cells. White blood cell clumps as well as mucus were present.   EKG showed normal sinus rhythm at 90 beats per minute, left  axis deviation, nonspecific intraventricular conduction delay, T wave abnormality, consider lateral ischemia according to the rate criteria. Chest x-ray was not performed. HISTORY AND PHYSICAL:  The patient is a 74 year old Caucasian female with past medical history significant for history of recent admission to the Emergency Room with abdominal pains, presents back to the hospital on 10/02/2012 with abdominal pains. Please refer to Dr. Camillia Herter admission note on 10/02/2012.   On arrival to the Emergency Room, the patient's temperature was 98, pulse was 90, respiratory rate was 18, blood pressure 110/67, saturation was 95%  on room air. Physical exam was unremarkable. The patient had diffuse tenderness without any rebound or guarding on abdominal exam.  She, according to admitting physician, had right-sided CVA tenderness.   HOSPITAL COURSE:  The patient was admitted to the hospital for further evaluation and started on antibiotic therapy with ciprofloxacin IV.   1.  In regards to abdominal pains, as mentioned above the patient was in the Emergency Room one day before on 10/01/2012.  She had at least 2 radiologic studies done, abdominal x-ray done on 10/01/2012 which showed no evidence of bowel obstruction or perforation or acute cardiopulmonary disease. The patient did have also CT scan of abdomen and pelvis without contrast the same day, June 1st, which revealed a small stone in bladder and change from prior studies, tiny stone in the right renal collecting system, mid pole, possibly with a second small stone on image #16 in lower pole. No left nephrolithiasis was noted.  Neither kidney showed obstructive changes. No ureterolithiasis was evident.  Evidence of cortical significant calcifications were consistent with scarring in both lower poles noted.  Fat filled  umbilical as well as paraumbilical hernia.  Atherosclerotic disease, moderately large hiatal hernia, previous hysterectomy as well as cholecystectomy changes, and diffuse degenerative changes in the lumbar spine and lower thoracic spine were noted. The patient was noted during this presentation to the Emergency Room to have a urinary tract infection.  As mentioned above., she was started on antibiotic therapy because of  right CVA tenderness. It was felt that she may have also acute pyelonephritis. She was treated, however, already on 10/03/2012. She was very agitated and requested to be discharged home as soon as possible.  Psychiatry consultation was even requested because of her significant agitation and discomfort. The psychiatrist, however, did not feel that it was  a psychiatric condition.  He felt that very likely it was related to anxiety. During psychiatric evaluation, the patient was calm and lucid and not panicky. Her memory was grossly normal range, but no signs of  dangerousness were noted, and Dr. Toni Amend recommended to discontinue standing Ativan order and make it as needed oral Ativan.  He recommended not to continue Haldol as the patient was not delirious.   On 10/04/2012, the patient, however, again requested to be discharged as soon as possible. She refused ultrasound of abdomen to better evaluate her for lower kidney stone. She also refused any other kind of kind of evaluation or consultations.  She was not willing to wait until urine cultures come back, so since her symptoms had improved, we  decided to discharge the patient on ciprofloxacin despite her known history of intolerance to ciprofloxacin.  The patient was advised to continue ciprofloxacin together with meals. She had no CVA tenderness on recurrent evaluation. It was felt that the patient's urinary tract infection, questionable pyelonephritis is being treated with current medications. It is recommended to follow up urine culture results and make sure  that the patient's antibiotic is working for bacteria the patient is having.   The patient was noted to be in acute renal failure on the day of her admission.  The patient's kidney function was somewhat impaired with creatinine level of 1.46; however, with IV fluid rehydration, the patient's kidney function almost normalized with creatinine level 1.23 on 10/03/2012. With discontinuation of IV fluids, the patient's creatinine again crept up to 1.45.  As mentioned above, we were hoping to get the patient to see a specialist, possibly a urologist for her kidney stones, and get ultrasound done to make sure that she is not having obstruction. However, she refused those studies.  She was recommended to follow up with her primary care physician and make  decisions about further investigation as needed.  Meanwhile, the patient was advised to get repeated lab studies done as soon as possible after discharge to make sure that her kidney function does not deteriorate.   In regards to anxiety, the patient is to continue her anti-anxiety medication as well as Ativan.  No further recommendations were made.   In regards to constipation, the patient was given 2 doses of medications and her constipation resolved. On day of discharge, the patient felt satisfactory, denied any pains, had no CVA tenderness, had no fevers.  Her temperature was 98.2, pulse was 65 to 80, respiratory rate was 19 to 20, blood pressure ranging from 95 to 129 systolic and 70s and 80s diastolic.  O2 saturations were 98% on room air at rest.   TIME SPENT: 40 minutes.   ____________________________ Katharina Caper, MD rv:cb D: 10/04/2012 19:28:24 ET T: 10/04/2012 22:55:29 ET JOB#: [id number]  cc: Katharina Caper, MD, <Dictator> Herbon E. Meredeth Ide, MD Katharina Caper MD ELECTRONICALLY SIGNED 10/25/2012 10:21

## 2014-08-23 NOTE — Consult Note (Signed)
PATIENT NAME:  Sharon HorsfallSHARPE, Linsay H MR#:  [id number] DATE OF BIRTH:  June 01, 1940  DATE OF CONSULTATION:  10/03/2012  REFERRING PHYSICIAN:   CONSULTING PHYSICIAN:  Audery AmelJohn T. Clapacs, MD  IDENTIFYING INFORMATION AND REASON FOR CONSULT: A 74 year old woman currently in the hospital for cystitis. Consultation because of agitation.   HISTORY OF PRESENT ILLNESS: Information obtained from the patient and the chart. The chart indicates that the patient was anxious and was a bit agitated earlier today. She was asking to be discharged and was seen to be almost confused in her anxiety. She was started on p.r.n. Haldol and Ativan earlier today, and a psychiatric consult was requested. When I came to see the patient today, she was of the mind of clearly wanting to make it known to me that she was not "crazy." I reassured her that that was not the concern. She admitted that she had felt anxious earlier and says that she always feels anxious around strangers. Being in the hospital in a strange environment around people she does not know made her very nervous and anxious, almost to the point of feeling panicky but she thought this was a typical response for her. She denies that she is having a worsening of her baseline anxiety. Denies being depressed. Denies any ongoing symptoms of depression. Denies any psychotic symptoms. She is not abusing substances. She says that she normally takes medicines at home for her anxiety including lorazepam and BuSpar, although she seems to take them only on her own p.r.n. schedule at pretty low doses. She totally denies any suicidal ideation. She does not feel like she is having a significant problem that needs further treatment changes at this time. She says that when she is at home living with her cat, she feels fine. Her son helps her out getting around and getting her groceries. When she is in her home environment, she does not get particularly anxious.   PAST PSYCHIATRIC HISTORY: Denies any  history of psychiatric hospitalizations. Denies any suicide attempts or violence. Denies any history of psychotic symptoms. She admits that she has had anxiety issues for a long time and specifically has them in situations around strangers. I see that on a prior admission some years ago, she had a similar problem with situational anxiety that was treated by Dr. Manson PasseyBrown. She does not see a psychiatrist for outpatient treatment. She gets her medication from her primary care doctor.   MEDICAL HISTORY: The patient has acute cystitis right now being treated with antibiotics. Also has hypothyroidism, high blood pressure, gastric reflux symptoms.   SOCIAL HISTORY: Lives alone with her cat but has an adult son who looks in on her frequently, drives her around and helps her to take care of her errands. She feels content with her home life. Does not have any complaints about it.   SUBSTANCE ABUSE HISTORY: None identified.   REVIEW OF SYSTEMS: Says that her pain in her abdomen is better and she is not feeling as sick to her stomach. She has been able to get up and ambulate more today. Mood is still feeling a little nervous but not nearly as much as earlier. Denies depression. Denies suicidal ideation. Denies psychotic symptoms.   MENTAL STATUS EXAM: The patient was cooperative with the interview. Good eye contact. Somewhat nervous psychomotor activity. She clearly was a little bit taken aback by having a psychiatrist come in to see her but handled it pretty well. Speech is normal in rate, tone and volume. Affect is  a little bit nervous but controlled. Mood was stated as okay. Thoughts were generally lucid. Did not seem to be confused or delusional. No hallucinations identified. Denies suicidal or homicidal ideation. She was able to recall 1 out of 3 objects at 3 minutes but was completely alert and oriented and able to explain her situation and treatment. Reasonable insight and judgment. Alert and oriented x4.    ASSESSMENT: A 74 year old woman with chronic anxiety, probably social anxiety, possibly generalized anxiety disorder. Had an increase in her anxiety symptoms in the hospital. She has already recovered from that and feels like she is back at her baseline. She does not appear to be delirious. Does not appear to be psychotic. Does not appear to be depressed.   TREATMENT PLAN: Did a lot of reassurance with her and some supportive therapy. Discussed her anxiety as she manages it outside the hospital and her current medication which seems to be appropriate. I am going to discontinue the Haldol and the standing dose of lorazepam and leave the p.r.n. lorazepam on her orders.   DIAGNOSIS, PRINCIPAL AND PRIMARY:  AXIS I: Generalized anxiety disorder.   SECONDARY DIAGNOSES:  AXIS I: Social anxiety disorder.  AXIS II: Deferred.  AXIS III: Acute cystitis.  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  AXIS V: Functioning at time of evaluation 54.    ____________________________ Audery Amel, MD jtc:gb D: 10/03/2012 21:14:10 ET T: 10/03/2012 22:08:58 ET JOB#: [id number]  cc: Audery Amel, MD, <Dictator> Audery Amel MD ELECTRONICALLY SIGNED 10/04/2012 21:41

## 2014-08-23 NOTE — Consult Note (Signed)
Brief Consult Note: Diagnosis: generalized anxiety disorder.   Patient was seen by consultant.   Consult note dictated.   Orders entered.   Comments: Psychiatry: Patient seen. 74 year old woman with past history of anxiety prob both social and general. Felt panicy earlier in unusual situation. Currently calm and lucid and not panicy. Memory grossly in normal range. No sign dangerousness. Will dc the standing ativan and continue prn oral ativan. No need for haldol - not delirious.  Electronic Signatures: Audery Amellapacs, Lindell Renfrew T (MD)  (Signed 03-Jun-14 18:45)  Authored: Brief Consult Note   Last Updated: 03-Jun-14 18:45 by Audery Amellapacs, Jasslyn Finkel T (MD)

## 2014-08-23 NOTE — H&P (Signed)
PATIENT NAME:  Sharon HorsfallSHARPE, Leaha H MR#:  [id number] DATE OF BIRTH:  [birth year]  DATE OF ADMISSION:  10/02/2012  PRIMARY CARE PHYSICIAN: Dr. Meredeth IdeFleming.   CHIEF COMPLAINT: Abdominal pain.   HISTORY OF PRESENT ILLNESS: This 74 year old female was seen yesterday in the ER with abdominal pain. Had a CT of the abdomen and a KUB, which essentially were normal. Was sent home, comes back in with the same complaints and was actually found to have a urinary tract infection. On physical exam, she has right left-sided flank pain, so probably has acute pyelonephritis. She is very anxious, complaining that she is constipated. Apparently she has chronic constipation problems. Her bowel movement she says was 2 days ago. She is very nauseous and vomited twice in the ER; therefore, the hospitalist is asked to evaluate her for admission.   REVIEW OF SYSTEMS:  CONSTITUTIONAL: She had a low-grade fever. Positive fatigue, weakness.  EYES: No blurred or double vision, glaucoma.  EARS, NOSE, THROAT: No hearing loss or ear pain, allergies or postnasal drip. RESPIRATORY: No cough, wheezing, hemoptysis, COPD. CARDIOVASCULAR: No chest pain, palpitations, syncope, orthopnea, arrhythmia, edema.  GASTROINTESTINAL: Positive nausea and vomiting. No diarrhea. Positive abdominal pain. No melena or ulcers. Positive GERD.  GENITOURINARY: Positive dysuria, frequency and urgency.  ENDOCRINE: No polyuria or polydipsia.  HEMATOLOGIC AND LYMPHATIC: No anemia or easy bruising.  SKIN: No rash or lesions.  MUSCULOSKELETAL: No limited activity. NEUROLOGIC: No history of CVA, TIA, seizures.  PSYCHIATRIC: She seems very anxious. She does not have a diagnosis of anxiety or depression.   PAST MEDICAL HISTORY:  1.  History of GI bleed with chronic hemorrhoidal bleeds.  2.  Anemia of chronic disease. 3.  Diverticulosis. 4.  GERD. 5.  COPD.  6.  History of kidney stones. 7.  CAD. 8.  Diabetes. 9.  Hypertension.  10.  Hypothyroidism.  PAST  SURGICAL HISTORY: 1.  Cardiac stents.  2.  Cholecystectomy.  3.  Appendectomy.  4.  Hysterectomy.  5.  CABG.   ALLERGIES: PENICILLIN CAUSED ANAPHYLAXIS. IVP PYELOGRAM DYE CAUSED RESPIRATORY ARREST. VALIUM: ALTERED MENTAL STATUS. CIPRO: GI DISTRESS. ASPIRIN: GI DISTRESS. CIPROFLOXACIN: GI DISTRESS. PREDNISONE: GI DISTRESS. PROTONIX, PRILOSEC, TIGAN, SULFA (UNKNOWN) AND NIACIN.   MEDICATIONS:   1.  Zantac 150 b.i.d.  2.  Tylenol Extra-Strength 2 tablets q4-6 hours p.r.n.  3.  Tramadol 50 mg twice a day.  4.  Metoprolol 50 mg 1/2 tablet q.12 hours.  5.  Ranitidine 150 mg b.i.d.  6.  Promethazine 25 mg daily.  7.  Oxazepam 15 mg at bedtime.  8.  Nitroglycerin sublingual p.r.n.  9.  MiraLAX 17 grams daily.  10.  Macrobid 100 mg b.i.d.  11.  Ativan 0.5 mg t.i.d.  12.  Lisinopril 5 mg daily.  13.  Synthroid 125, 1/2 tablet daily.  14.  Lasix 40 mg daily.  15.  Lactulose 30 mg 2 to 3 tablets in the morning.  16.  Flovent 2 puffs 2 times a day.  17.  Ex-Lax 3 tablets daily.  18. colace 2 tablets daily.  19.  Chlorpropamide 250 mg 1 in the morning and 2 at night.  20.  Buspirone 10 mg b.i.d.  21.  Bentyl 20 mg q.i.d. p.r.n.  22.  AcipHex 20 mg daily.   FAMILY HISTORY: Positive for CAD, hypertension. Mother had kidney disease.   SOCIAL HISTORY: No tobacco, alcohol or drug use. She lives by herself with her cat.   PHYSICAL EXAMINATION: VITAL SIGNS: Temperature 98, pulse 90, respirations  18, blood pressure 110/67, 95% on room air.  GENERAL: The patient is very anxious, does not appear to be in acute distress.  HEENT: Head is atraumatic. Pupils are round and reactive. Sclerae are anicteric. Mucous membranes are moist. Oropharynx is clear.  NECK: Supple without JVD, carotid bruit or enlarged thyroid.  CARDIOVASCULAR: Regular rate and rhythm. No murmurs, gallops or rubs. PMI is not displaced. ABDOMEN: Diffuse tenderness without any rebound, guarding. No abdominal masses palpable.   LUNGS:  Clear to auscultation without crackles, rales, rhonchi or wheezing. Normal to percussion.  BACK: She has right-sided CVA tenderness.  EXTREMITIES: No clubbing, cyanosis or edema. NEUROLOGIC: Cranial nerves II through XII are grossly intact. No focal deficits. SKIN: Without rash or lesions.  LABORATORY, DIAGNOSTIC AND RADIOLOGICAL DATA: White blood cells 7.2, hemoglobin 10.7, hematocrit 31.7, platelets 198. Sodium 137, potassium 3.9, chloride 107, bicarbonate 23, BUN 24, creatinine 1.46, glucose 85, calcium 8.7, bilirubin 0.5, alkaline phosphatase 101, ALT 17, AST 26, total protein 7.0, albumin 3.5, lipase 199. Urinalysis with 3+ LCE with 100 white blood cells. CT of the abdomen and pelvis performed June 1 had no acute abdominal pathology. Abdominal 3-way showed no acute pathology. EKG: Normal sinus rhythm, no ST elevation or depression.   ASSESSMENT AND PLAN: A 74 year old female who presents with abdominal pain, found to have right acute pyelonephritis. 1.  Acute pyelonephritis with costovertebral angle tenderness on the right side. The patient has multiple allergies to antibiotics. She had a urine culture a couple of months ago which was positive for Escherichia coli, sensitive to ciprofloxacin. Her allergy to Cipro is just gastrointestinal distress. She is receiving Levaquin here in the Emergency Room. I will continue the fluoroquinolone. Urine cultures have been ordered in the Emergency Room. 2.  Abdominal pain: The patient is extremely anxious about her abdominal pain and not having a bowel movements for 2 days. However, she has had persistent nausea and vomiting for the past few days and has not been able to take anything orally. I explained this to her, but she would really like to try something for her constipation. She had a CT of the abdomen and an x-ray of her abdomen yesterday in the Emergency Room, which essentially did not show any acute pathology. I have written for a Fleet Enema and her  outpatient medications.  3.  Nausea and vomiting secondary to problem #1. Will continue with supportive care.  4.  Acute renal failure: Her creatinine in 2010 was 1.10. This is likely secondary to nausea, vomiting and dehydration. We will provide some intravenous fluids and repeat a BMP in the morning.  5.  Hypertension: Will continue her outpatient medications.  6.  She does not have a formal diagnosis in the chart of anxiety/depression, but she is taking something for anxiety, buspirone, which we will continue.  7.  Hypothyroidism, for which we will continue Synthroid  CODE STATUS: The patient is a full code status.   TIME SPENT: Approximately 40 minutes.   ____________________________ Janyth Contes. Juliene Pina, MD spm:jm D: 10/02/2012 15:52:17 ET T: 10/02/2012 16:38:30 ET JOB#: [id number]  cc: Desaree Downen P. Juliene Pina, MD, <Dictator> Herbon E. Meredeth Ide, MD Janyth Contes Kima Malenfant MD ELECTRONICALLY SIGNED 10/02/2012 19:39

## 2014-08-24 NOTE — H&P (Signed)
PATIENT NAME:  Sharon Weiss, Sharon Weiss MR#:  [id number] DATE OF BIRTH:  14-Jan-1941  DATE OF ADMISSION:  10/07/2013  REFERRING PHYSICIAN:  Dr. Fanny Bien  PRIMARY CARE PHYSICIAN: Dr. Leotis Shames, [facility]  CHIEF COMPLAINT: Abdominal pain.   HISTORY OF PRESENT ILLNESS: A 74 year old Caucasian female with a history of diabetes, coronary artery disease, COPD, gastroesophageal reflux disease, hypertension presenting with abdominal pain, right-sided, sharp, 7/10 intensity, nonradiating, somewhat worsened with movement, no relieving factors.  No fevers or chills or further symptomatology.  Called her PCP with the above symptoms and instructed to present to the hospital for further workup and evaluation for her persistent pain as this has been going on intermittently for about 1 week in total duration.  Complaining only of pain as described above.  REVIEW OF SYSTEMS:  CONSTITUTIONAL:  Denies fever.  Positive for fatigue and weakness. EYES:  Denied blurred vision, double vision, eye pain. EARS, NOSE, AND THROAT:  Denies tinnitus, ear pain, hearing loss. RESPIRATORY: Denies coughing or shortness of breath. CARDIOVASCULAR: Denies chest pain, palpitations, or edema. GASTROINTESTINAL: Denies nausea, vomiting, diarrhea or abdominal pain. GENITOURINARY:  Denies dysuria or hematuria or change in urinary frequency. ENDOCRINE: Denies nocturia or thyroid problems. HEMATOLOGY AND LYMPHATIC: Denies easy bruising, bleeding. SKIN:  Denies rash or lesion. MUSCULOSKELETAL:  Denies pain in neck, back, shoulder, knees, hips, arthritic symptoms. NEUROLOGICAL:  He denies paralysis, paresthesia. PSYCHIATRIC:  Denies oppressive symptoms. Otherwise, full review of systems performed by me is negative.   PAST MEDICAL HISTORY:  Diabetes, coronary artery disease, COPD, history of paroxysmal atrial fibrillation, gastroesophageal reflux disease, hypertension, hypothyroidism.  SOCIAL HISTORY: Denies any tobacco, alcohol or drug  usage.  FAMILY HISTORY:  Positive for coronary artery disease as well as hypertension  ALLERGIES: TO ASPIRIN, CIPRO, (Dictation Anomaly) <<MISSING TEXT>> , IV CONTRAST DYE, NIACIN, PENICILLIN, PREDNISONE, PRILOSEC, PROTONIX, SULFA DRUGS, TIGAN, AND VALIUM.    HOME MEDICATIONS: Include acetaminophen hydrocodone 325 mg/5 mg 1 tablet daily as needed for pain, tramadol 50 mg every 4 hours as needed for pain, Flomax 0.4 mg p.o. daily, nitroglycerin 0.4 mg sublingual tablet every 5 minutes as needed for chest pain, lorazepam 1 mg half tablet to 1 tablet b.i.d. as needed for anxiety, promethazine 25 mg p.o. q. 6 hours as needed for nausea or vomiting, buspirone 10 mg p.o. b.i.d., oxazepam 15 mg p.o. at bedtime, rimantadine 300 mg p.o. b.i.d., Colace 200 mg p.o. at bedtime, lactulose 30 mL b.i.d. as needed for constipation, Senna Lax 8.6 mg 2 tablets at bedtime as needed for constipation, levothyroxine 125 mcg half tablet p.o. daily.  PHYSICAL EXAMINATION: VITAL SIGNS: Temperature of 98.3, heart rate 95, respirations 18, blood pressure 107/58, saturating 98% on room air, weight 78 kg, BMI 30.5.  GENERAL:  Chronically weak-appearing Caucasian female in no acute distress. HEAD:  Normocephalic, atraumatic. EYES:  Pupils equal, round, reactive to light.  Extraocular movements intact.  No scleral icterus.  MOUTH:  Moist mucous membranes.  Dentition intact.  No abscess noted. EARS, NOSE, AND THROAT:  Clear without exudates. No external lesion. NECK:  No thyromegaly or nodules.  No JVD.  PULMONARY: Clear to auscultation bilaterally without wheezes, rales, rhonchi.  No use of accessory muscles.  Good  respiratory rate. Chest nontender to palpation.  CARDIOVASCULAR: S1, S2. Regular rate and rhythm.  No murmurs, rubs, or gallops.  No edema.  Pedal pulses 2+ bilaterally. GASTROINTESTINAL: Soft, minimal tenderness.  Palpation to right suprapubic region without rebound or guarding. No motion tenderness.  Positive bowel  sounds.  No hepatosplenomegaly.   MUSCULOSKELETAL:  No swelling,  clubbing, edema.  Range of motion full to all extremities. NEUROLOGICAL:  Cranial nerves II through XII intact.  No gross focal neurologic deficits.  Sensation intact. Reflexes intact. SKIN:  No ulcerations, lesions, or rashes. Skin warm and dry. Turgor intact. PSYCHIATRIC:  Mood and affect within normal limits.  Awake, alert, oriented x 3.  Insight and judgment intact.  LABORATORY DATA:  Sodium 137, potassium 3.9, chloride 109, bicarbonate of 20, anion gap of 8, BUN 15, creatinine 197, glucose 121.  WBC 6.7, hemoglobin 10.6, platelets 182,000.  Urinalysis:  WBC 10, RBC 3, leukocyte esterase trace, nitrite negative.  Epithelial cells 1.    RADIOLOGICAL DATA:  Renal ultrasound revealing right hydronephrosis with 9 mm and 8 mm renal calculi noted.    ASSESSMENT AND PLAN:  A [age] year old Caucasian female with history of diabetes, coronary artery disease, chronic obstructive pulmonary disease presenting with abdominal pain with known renal stones. 1. Acute kidney injury. IV fluid hydration.  Follow the urine output and renal functions. Question if this is post (Dictation Anomaly) <<MISSING TEXT>>. Consult urology, given hydronephrosis. 2. Nephrolithiasis with right hydronephrosis. Urology consult, pain control.  Initiate bowel regimen. 3. Diabetes.  Hold p.o. agents and start insulin sliding scale.   4. Urinary tract infection.  Follow up cultures.  Continue with Levaquin given her allergies. 5. Gastroesophageal reflux disease.  Can give her Zantac. 6. Venous thromboembolism prophylaxis. Sequential compression devices. 7. Patient (Dictation Anomaly) <<MISSING TEXT>>.  TIME SPENT:  45 minutes.   ____________________________ Durward MallardJoel B. Marguerite OleaMoffett, MD jbm:dd D: 10/07/2013 22:42:18 ET T: 10/08/2013 01:47:19 ET JOB#: [id number]  cc: Durward MallardJoel B. Marguerite OleaMoffett, MD, <Dictator>

## 2014-08-24 NOTE — Op Note (Signed)
PATIENT NAME:  Sharon Weiss, Sharon Weiss MR#:  [id number] DATE OF BIRTH:  1940/12/30  DATE OF PROCEDURE:  10/08/2013  PREOPERATIVE DIAGNOSES:  1.  Right hydronephrosis.  2.  Right ureterolithiasis.  3.  Right nephrolithiasis.   POSTOPERATIVE DIAGNOSES:  1.  Right hydronephrosis.  2.  Right ureterolithiasis.  3.  Right nephrolithiasis.   PROCEDURES: 1.  Cystoscopy with right double pigtail stent placement.  2.  Fluoroscopy.   SURGEON: Anola GurneyMichael Wolff, M.D.   ANESTHETIST: Clarene DukeBhandari and Wolff.   ANESTHETIC METHOD: General per Ronalee BeltsBhandari and local per Dr. Evelene CroonWolff.   INDICATIONS: See the dictated consultation. After informed consent, the patient requests the above procedure.   OPERATIVE SUMMARY: After adequate general anesthesia had been obtained, the patient was placed into dorsal lithotomy position and the perineum was prepped and draped in the usual fashion. Fluoroscopy indicated at least 2 stones in the upper right ureter and several other stones in the right renal shadow. At this point, the 21-French cystoscope was coupled with the camera and then placed into the bladder. The bladder was thoroughly inspected. Both orifices were identified and had clear efflux. The patient had a bladder clot in the bladder which was evacuated. At this point, a 0.035 Glidewire was advanced up the right ureter under fluoroscopic guidance. A 6 x 24 cm double pigtail stent was then advanced over the guidewire and positioned in the ureter. Guidewire was then removed taking care to leave the stent in position. The bladder was then drained and the cystoscope was removed. Then 10 mL of viscous Xylocaine was instilled within the urethra and the bladder. A 16-French Foley catheter was placed. B and O suppository was placed. The procedure was then terminated and the patient was transferred to the recovery room in stable condition.  ____________________________ Suszanne ConnersMichael R. Evelene CroonWolff, MD mrw:sb D: 10/08/2013 14:54:48 ET T: 10/08/2013  15:06:44 ET JOB#: [id number]  cc: Suszanne ConnersMichael R. Evelene CroonWolff, MD, <Dictator> Orson ApeMICHAEL R WOLFF MD ELECTRONICALLY SIGNED 10/09/2013 9:10

## 2014-08-24 NOTE — Consult Note (Signed)
VITAL SIGNS/ANCILLARY NOTES: **Vital Signs.:   09-Jun-15 02:35  Temperature Temperature (F) 97.4    06:18  Temperature Temperature (F) 97.1   Lab Results: Routine Chem:  07-Jun-15 12:33   BUN 15  Creatinine (comp)  1.97   Assessment/Plan:  Assessment/Plan:  Assessment S/p stent placement   Plan Doing well. Afebrile. OK to discharge from urology standpoint. Follow-up in the office.   Electronic Signatures: Orson ApeWolff, Michael R (MD)  (Signed 09-Jun-15 09:00)  Authored: VITAL SIGNS/ANCILLARY NOTES, Lab Results, Assessment/Plan   Last Updated: 09-Jun-15 09:00 by Orson ApeWolff, Michael R (MD)

## 2014-08-24 NOTE — Consult Note (Signed)
Brief Consult Note: Diagnosis: Anxiety disorder with OCD traits, r/o delusional disorder.   Patient was seen by consultant.   Consult note dictated.   Recommend further assessment or treatment.   Orders entered.   Comments: [name] has no psychiatric past. She has been frequenting our ER complaining of abdominal pain and has been seen by psychiatry several times. She now resides in a nursing home which she hates. She is worried that they give her "wrong medications there". She gets confused when the wrong medication is given.     Today the patieny is not suicidal or homicidal. She does not complain of pain either. She is anxious of not having a BM.    PLAN: 1. The patient does not meet criteria for psychiatric admission.  2. We will add Luvox for anxiety/OCD and low dose antipsychotic for delusions. Rx given.  3. She is to continue BuSpar.  4. SW to return her to the facility.  Electronic Signatures: Kristine LineaPucilowska, Faelynn Wynder (MD)  (Signed 20-Jul-15 15:43)  Authored: Brief Consult Note   Last Updated: 20-Jul-15 15:43 by Kristine LineaPucilowska, Khamil Lamica (MD)

## 2014-08-24 NOTE — Consult Note (Signed)
Brief Consult Note: Diagnosis: Right hydronephrosis with renal colic. Azotemia.   Patient was seen by consultant.   Consult note dictated.   Recommend to proceed with surgery or procedure.   Orders entered.   Discussed with Attending MD.   Comments: Plan cysto-stent placement today.  Electronic Signatures: Orson ApeWolff, Michael R (MD)  (Signed 08-Jun-15 08:06)  Authored: Brief Consult Note   Last Updated: 08-Jun-15 08:06 by Orson ApeWolff, Michael R (MD)

## 2014-08-24 NOTE — Consult Note (Signed)
PATIENT NAME:  Sharon Weiss, Sharon Weiss MR#:  [id number] DATE OF BIRTH:  Oct 24, 1940  DATE OF CONSULTATION:  10/08/2013  REFERRING PHYSICIAN:  Wonda ChengJoel Moffett, MD CONSULTING PHYSICIAN:  Suszanne ConnersMichael R. Evelene CroonWolff, MD  REASON FOR CONSULTATION: Hydronephrosis and flank pain.   HISTORY OF PRESENT ILLNESS: Sharon Weiss is a 74 year old Caucasian female with a 1 week history of intermittent right flank pain which became severe prompting hospitalization yesterday. She is intolerant to contrast and therefore had renal ultrasound performed on June 7th indicating bilateral renal calculi and right hydronephrosis.   The patient does have a past history of kidney stones and underwent lithotripsy on the left side over 20 years ago.   ALLERGIES: ASPIRIN, CIPRO, IV CONTRAST, NIACIN, PENICILLIN, PREDNISONE, PRILOSEC, PROTONIX, SULFA, TIGAN AND VALIUM.   CHRONIC HOME MEDICATIONS: Included hydrocodone, tramadol, Flomax, nitroglycerin, lorazepam, Phenergan, BuSpar, oxazepam, ranitidine, Colace, lactulose, senna, levothyroxine.  PAST SURGICAL HISTORY:  1.  Left lithotripsy in 1990.  2.  Left ureteroscopy in 2002.  3.  Lumbar laminectomy.  4.  Hysterectomy.  5.  Cholecystectomy.  6.  Coronary bypass graft x5 with stent placement in 1995.   SOCIAL HISTORY: The patient denied tobacco or alcohol use.   FAMILY HISTORY: Positive for hypertension, diabetes, and heart disease.   PAST AND CURRENT MEDICAL CONDITIONS:  1.  Coronary disease status post bypass surgery.  2.  Congestive heart failure.  3.  Type 2 diabetes. 4.  Irritable bowel syndrome.  5.  Chronic iron deficiency anemia.  6.  Chronic joint disease.  7.  COPD. 8.  Anxiety disorder.   REVIEW OF SYSTEMS: The patient chronically has shortness of breath. She denied chest pain.   PHYSICAL EXAMINATION: GENERAL: A chronically ill-appearing and obese white female in no distress.  HEENT: Sclerae were clear.  NECK: Supple. No palpable cervical adenopathy.  LUNGS: Clear to  auscultation.  ABDOMEN: Soft. Mild right flank tenderness. GENITOURINARY: Deferred. RECTAL: Deferred.  NEUROMUSCULAR: Nonfocal. The patient was alert and oriented x3.   DIAGNOSTIC DATA: Renal ultrasound report dated June 7th and KUB report dated June 7th were reviewed.   BUN was 15 and creatinine 1.97 mg/dL on June 7th.  Hematocrit was 33.1% with a white cell count of 6700 on June 7th.  IMPRESSION:  1.  Right hydronephrosis with renal colic.  2.  Nephrolithiasis.  3.  Azotemia.   PLAN: Cystoscopy with stent placement. ____________________________ Suszanne ConnersMichael R. Evelene CroonWolff, MD mrw:sb D: 10/08/2013 08:15:29 ET T: 10/08/2013 08:43:53 ET JOB#: [id number]  cc: Suszanne ConnersMichael R. Evelene CroonWolff, MD, <Dictator> Leotis ShamesJasmine Singh, MD Durward MallardJoel B. Marguerite OleaMoffett, MD Orson ApeMICHAEL R Mirra Basilio MD ELECTRONICALLY SIGNED 10/08/2013 12:47

## 2014-08-24 NOTE — Consult Note (Signed)
Brief Consult Note: Diagnosis: Anxiety due to medical condition.   Patient was seen by consultant.   Recommend further assessment or treatment.   Comments: Ms. Cedric FishmanSharpe has no psychiatric past. She has been frequenting our ER complaining of abdominal pain. She was seen by Dr. Guss Bundehalla in consultation on 6/13. Today, she had 2 small loose BM in my presence, complains of abdomin al pain, pain from her hemmoroid and urinary retention. There is a h/o renal calculi and new UTI treated today. She is very uncomfortable physically.   She is not suicidal or homicidal, just anxious and in pain. She moves very slowly and with difficulties. She liveas by herself with a cat. She is uncertain if she can continue living independently.   PLAN: 1. The patient does not meet criteria for psychiatric admission.  2. We have been trying to contact her son who is back in town with no success.  3. No medications recommended beyond BuSpar.  4. SW consult for placement.  Electronic Signatures: Kristine LineaPucilowska, Adalene Gulotta (MD)  (Signed 15-Jun-15 18:29)  Authored: Brief Consult Note   Last Updated: 15-Jun-15 18:29 by Kristine LineaPucilowska, Olivene Cookston (MD)

## 2014-08-24 NOTE — Discharge Summary (Signed)
Dates of Admission and Diagnosis:  Date of Admission 07-Oct-2013   Date of Discharge 09-Oct-2013   Admitting Diagnosis Right flank pain, right hydronephrosis, bilateral renal calculi, renal insufficiency,   Final Diagnosis Right ureteral calculus, moderate right hydronephrosis, bilateral renal calculi, renal insufficiency   Discharge Diagnosis 1 Right ureteral calculus, moderate right hydronephrosis, bilateral renal calculi, renal insufficiency   2 Chronic constipation   3 Hypothyroidism    Chief Complaint/History of Present Illness Elderly lady with h/o urinary calculi presented with severe right flank pain for several days. ED work up revealed right hydronephrosis, bilateral renal calculi and renal insufficiency with Creatinine of 1.97 on initial labs.   Allergies:  Penicillin: Anaphylaxis  IVP Dye: Resp Arrest  Valium: Alt Ment Status  Prednisone: GI Distress, Hives  Cipro: GI Distress  ASA: GI Distress  Darvon: GI Distress  Other- Explain in Comments Line: Other  Protonix: Other  Tigan: Unknown  Prilosec: Unknown  Sulfa drugs: Unknown  Niacin: Unknown    Routine Micro:  07-Jun-15 12:33   Organism Name GRAM NEGATIVE ROD  Organism Quantity 2000 CFU/ML  Micro Text Report URINE CULTURE   ORGANISM 1                2000 CFU/ML GRAM NEGATIVE ROD   ANTIBIOTIC                       Specimen Source CLEAN CATCH  Organism 1 2000 CFU/ML GRAM NEGATIVE ROD  Result(s) reported on 08 Oct 2013 at 01:56PM.  Routine Chem:  07-Jun-15 12:33   Glucose, Serum  121  BUN 15  Creatinine (comp)  1.97  Sodium, Serum 137  Potassium, Serum 3.9  Chloride, Serum  109  CO2, Serum  20  Calcium (Total), Serum  8.1  Anion Gap 8  Osmolality (calc) 276  eGFR (African American)  29  eGFR (Non-African American)  25 (eGFR values <104m/min/1.73 m2 may be an indication of chronic kidney disease (CKD). Calculated eGFR is useful in patients with stable renal function. The eGFR calculation will  not be reliable in acutely ill patients when serum creatinine is changing rapidly. It is not useful in  patients on dialysis. The eGFR calculation may not be applicable to patients at the low and high extremes of body sizes, pregnant women, and vegetarians.)  Routine UA:  07-Jun-15 12:33   Color (UA) Yellow  Clarity (UA) Hazy  Glucose (UA) Negative  Bilirubin (UA) Negative  Ketones (UA) Negative  Specific Gravity (UA) 1.004  Blood (UA) 2+  pH (UA) 5.0  Protein (UA) Negative  Nitrite (UA) Negative  Leukocyte Esterase (UA) Trace (Result(s) reported on 07 Oct 2013 at 12:58PM.)  RBC (UA) 3 /HPF  WBC (UA) 10 /HPF  Bacteria (UA) TRACE  Epithelial Cells (UA) 1 /HPF  Mucous (UA) PRESENT (Result(s) reported on 07 Oct 2013 at 12:58PM.)  Routine Hem:  07-Jun-15 12:33   WBC (CBC) 6.7  RBC (CBC)  3.10  Hemoglobin (CBC)  10.6  Hematocrit (CBC)  33.1  Platelet Count (CBC) 182  MCV  107  MCH  34.2  MCHC 32.0  RDW 13.0  Neutrophil % 84.9  Lymphocyte % 7.4  Monocyte % 6.6  Eosinophil % 0.9  Basophil % 0.2  Neutrophil # 5.7  Lymphocyte #  0.5  Monocyte # 0.4  Eosinophil # 0.1  Basophil # 0.0 (Result(s) reported on 07 Oct 2013 at 12:55PM.)   PERTINENT RADIOLOGY STUDIES: XRay:    07-Jun-15 18:03, KUB -  Kidney Ureter Bladder  KUB - Kidney Ureter Bladder   REASON FOR EXAM:    R flank pain, dx with kidney stone on R  COMMENTS:       PROCEDURE: DXR - DXR KIDNEY URETER BLADDER  - Oct 07 2013  6:03PM     CLINICAL DATA:  Right flank pain.  Urolithiasis.    EXAM:  ABDOMEN - 1 VIEW    COMPARISON:  None.    FINDINGS:  Small bowel and colonic gas is seen to the level the rectum. There  is no evidence of bowel obstruction.  Right upper quadrant surgical seen consistent prior cholecystectomy.  Small less than 1 cm bilateral renal calculi are noted. Pelvic  phleboliths noted bilaterally but no definite radiopaque ureteral  calculi are visualized. No radio-opaque calculi or other  significant  radiographic abnormality are seen.     IMPRESSION:  Small bilateral renal calculi. No definite ureteral calculi  visualized.    Non obstructive bowel gas pattern      Electronically Signed    By: Earle Gell M.D.    On: 10/07/2013 18:23         Verified By: Marlaine Hind, M.D.,  [place]:    07-Jun-15 20:21, US Kidney Bilateral  US Kidney Bilateral   REASON FOR EXAM:    right flank pain since diagnosed with k stone on R  COMMENTS:       PROCEDURE: [place]  - US KIDNEY  - Oct 07 2013  8:21PM     CLINICAL DATA:  Right flank pain.    EXAM:  RENAL/URINARY TRACT ULTRASOUND COMPLETE    COMPARISON:  09/30/2013 CT.    FINDINGS:  Technically difficult exam due to body habitus.  Right Kidney:    Length: 10.7 cm. Two calculi are present within the kidney, 9 mm  mid: 8 mm lower pole. Mild to moderate right hydronephrosis  persists, likely related to the proximal ureteral calculus  demonstrated on CT. Generalized cortical thinning with of focal area  of parenchymal loss associated calcification in the lower pole..    Left Kidney:    Length: 10.7 cm. 7 mm midpole calculus. Two lower pole renal  calculi, largest 8 mm. Generalized cortical thinning..    Bladder:  Appears normal for degree of bladder distention. Unable to confirm  or exclude bladder calculus noted previously     IMPRESSION:  Right hydronephrosis persists.  See discussion above.      Electronically Signed    By: Rolla Flatten M.D.    On: 10/07/2013 20:44         Verified By: Staci Righter, M.D.,  CT:    31-May-15 10:28, CT Abdomen and Pelvis Without Contrast  CT Abdomen and Pelvis Without Contrast   REASON FOR EXAM:    (1) abdominal pain, IV contrast allergy; (2)   abdominal pain, IV contrast allergy  COMMENTS:   May transport without cardiac monitor    PROCEDURE: CT  - CT ABDOMEN AND PELVIS W0  - Sep 30 2013 10:28AM     CLINICAL DATA:  Lower abdominal pain    EXAM:  CT ABDOMEN AND PELVIS  WITHOUT CONTRAST    TECHNIQUE:  Multidetector CT imaging of the abdomen and pelvis was performed  following the standard protocol without IV contrast.  COMPARISON:  08/13/2013    FINDINGS:  The lung bases are clear.    There is a 5 mm proximal right ureteral calculus resulting in  moderate right hydronephrosis.  There are multiple nonobstructing  bilateral renal calculi. There is bilateral renal cortical scarring.  There is a small hypodense left upper pole renal mass most  consistent with a cyst again noted. No perinephric stranding is  seen. There is a 2 mm bladder calculus again noted.    The liver demonstrates no focal abnormality. The gallbladder is  surgically absent. The spleen demonstrates nofocal abnormality. The  adrenal glands and pancreas are normal.  The unopacified stomach, duodenum, small intestine and large  intestine are unremarkable, but evaluation is limited by lack of  oral contrast. There is a moderate-sized hiatal hernia. There is a  fat containing umbilical hernia. There is no pneumoperitoneum,  pneumatosis, or portal venous gas. There is no abdominal or pelvic  free fluid. There is no lymphadenopathy.    The abdominal aorta is normal in caliber with atherosclerosis.    There is degenerative disc disease and degenerative facet  arthropathy throughout the lumbar spine.     IMPRESSION:  1. No bowel obstruction.  2. 5 mm proximal right ureteral calculus resulting in moderate right  hydronephrosis.  3. Bilateral renal cortical scarring.  4. Bilateral nephrolithiasis.  5. Fact containing umbilical hernia.  6. Moderate sized hiatal hernia.  7. Lumbar spine spondylosis.      Electronically Signed    By: Kathreen Devoid    On: 09/30/2013 10:39         Verified By: Jennette Banker, M.D., MD   Pertinent Past History:  Pertinent Past History Bilateral renal calculi Hypothyroidism Chronic constipation   Hospital Course:  Hospital Course Elderly lady  admitted with right flank pain, right hydronephrosis, bilateral renal calculi and renal insufficiency, Cr=1.97. She was evaluated by urology. Right ureteral calculus with moderate right hydronephrosis noted on CT abdomen. Patient underwent cystoscopy and stent placement yesterday. Pain has improved dramatically. She did not ask for pain medicine today. Constipation: continue stool softener, Miralax and enema prn. Synthroid was continued for hypothyroidism. GI prophylaxis. Patient awake and alert, eager to go home. HENT: No JVD, no pallor. Chest: bilateral clear to auscultation. CVS: RRR, S1S2, Abdomen: soft, nontender, +BS. Extremities: no edema. Patient is ambulating in the room without any difficulty. Plan is to discharge home.   Condition on Discharge Stable   DISCHARGE INSTRUCTIONS HOME MEDS:  Medication Reconciliation: Patient's Home Medications at Discharge:     Medication Instructions  oxazepam 15 mg oral capsule  1 cap(s) orally once a day (at bedtime)   nitroglycerin 0.4 mg sublingual tablet  1 tab(s) sublingual every 5 minutes up to  3 doses as needed for chest pain, if no relief call md or go to emergency room.   buspirone 10 mg oral tablet  1 tab(s) orally 2 times a day.   ex-lax maximum relief formula  4 tab(s) orally once a day (at bedtime)   lactulose 10 g/15 ml oral syrup  30 milliliter(s) orally 2 times a day, As Needed - for Constipation   ranitidine 300 mg oral tablet  1 tab(s) orally 2 times a day   levothyroxine 125 mcg (0.125 mg) oral tablet  0.5 tab (62.47mg) orally once a day (in the morning) at 5am   lorazepam 1 mg oral tablet  0.5 to 1 tab(s) orally every 12 hours, As Needed   promethazine 25 mg oral tablet  1 tab(s) orally every 6 hours, As Needed - for Nausea, Vomiting   docusate sodium sodium 100 mg oral capsule  2 caps (2050m  orally once a  day (at bedtime)   flomax 0.4 mg oral capsule  1 cap(s) orally once a day   acetaminophen-hydrocodone 325 mg-5 mg oral tablet   0.5 to 1 tab(s) orally once a day as needed for pain   dicyclomine 20 mg oral tablet  1 tab(s) orally 4 times a day   tramadol 50 mg oral tablet  1  orally every 8 hours, As Needed - for Pain   levaquin 750 mg oral tablet  1 tab(s) orally every 48 hours   erythromycin  1 application  4 times a day    PRESCRIPTIONS: ELECTRONICALLY SUBMITTED  STOP TAKING THE FOLLOWING MEDICATION(S):    percocet 5/325 325 mg-5 mg tablet: 1 tab(s) orally every 6 hours x 3 days, As Needed for pain that does not respond to ibuprofen  Physician's Instructions:  Diet Renal Diet   Activity Limitations As tolerated   Return to Work Not Applicable   Time frame for Follow Up Appointment 1-2 weeks  Urologist   Time frame for Follow Up Appointment 1-2 weeks  Dr. Dwyane Luo, Cristofher Livecchi(Attending Physician): [facility], [address] [place], [place] [address], [place]   Maryan Puls R(Consultant): [place], [address], [place], [place] [address], [place] [phone]  Electronic Signatures: Glendon Axe (MD)  (Signed 09-Jun-15 13:57)  Authored: ADMISSION DATE AND DIAGNOSIS, CHIEF COMPLAINT/HPI, Allergies, PERTINENT LABS, PERTINENT RADIOLOGY STUDIES, PERTINENT PAST HISTORY, HOSPITAL COURSE, [place], PATIENT INSTRUCTIONS, Follow Up Physician   Last Updated: 09-Jun-15 13:57 by Glendon Axe (MD)

## 2014-08-24 NOTE — Discharge Summary (Signed)
PATIENT NAME:  Sharon Weiss, Sharon Weiss MR#:  [id number] DATE OF BIRTH:  06-Nov-1940  DATE OF ADMISSION:  08/10/2013 DATE OF DISCHARGE:  08/12/2013  DISCHARGE DIAGNOSES: 1.  Ileus.  2.  Constipation.  3.  Chest pain.   HISTORY OF PRESENT ILLNESS: Please see initial history and physical for details. Briefly, this is a 74 year old female with history of COPD, coronary artery disease, atrial fibrillation, chronic constipation and irritable bowel syndrome who came in with abdominal and chest pain. This had been going on for 4 days. She had not been eating and drinking well. She has not had a bowel movement in 1 to 2 days. She had been vomiting a few times as well. On admission, the patient was found to be dehydrated with elevated BUN and creatinine with a creatinine up to 1.7. She also had a CT scan of the abdomen which showed a mild ileus.   HOSPITAL COURSE:  1.  Abdominal pain, likely due to ileus. The patient was treated with bowel regimen and started to have bowel movements. Her abdominal pain improved. She was able to advance her diet. It was felt patient could be discharged home on orals. She has a lot of issues with chronic constipation and takes a complicated bowel regimen including Ex-Lax, Colace and lactulose. This is a chronic problem for several decades per the patient. She will continue to increase her fluid intake as well and try to make sure her bowel regimen is working.  2.  Chest pain. Cardiac enzymes were negative.  3.  Hypothyroidism. TSH was normal. She was continued on her Synthroid.  4.  Hypokalemia and hypomagnesemia. These were repleted.   DISCHARGE MEDICATIONS: Please see [facility] discharge instructions for details.   DISCHARGE DIET: Low salt, regular consistency.   DISCHARGE FOLLOWUP:  The patient will follow up with Dr. Leotis ShamesJasmine Singh in 1 to 2 weeks.   DISCHARGE ACTIVITY: As tolerated.   TIME SPENT: This discharge took 35 minutes.   ____________________________ Stann Mainlandavid P.  Sampson GoonFitzgerald, MD dpf:cs D: 08/12/2013 10:19:16 ET T: 08/12/2013 19:19:29 ET JOB#: [id number]  cc: Stann Mainlandavid P. Sampson GoonFitzgerald, MD, <Dictator> Donnie Gedeon Sampson GoonFITZGERALD MD ELECTRONICALLY SIGNED 08/14/2013 22:16 Kymari Nuon Sampson GoonFITZGERALD MD ELECTRONICALLY SIGNED 08/14/2013 22:16

## 2014-08-24 NOTE — H&P (Signed)
PATIENT NAME:  Sharon Weiss, KILLINGSWORTH MR#:  [id number] DATE OF BIRTH:  01-Jun-1940  DATE OF ADMISSION:  10/07/2013  REFERRING PHYSICIAN:  Dr. Fanny Bien  PRIMARY CARE PHYSICIAN: Dr. Leotis Shames, [facility]  CHIEF COMPLAINT: Abdominal pain.   HISTORY OF PRESENT ILLNESS: A 74 year old Caucasian female with a history of diabetes, coronary artery disease, COPD, gastroesophageal reflux disease, hypertension presenting with abdominal pain, right-sided, sharp, 7/10 intensity, nonradiating, somewhat worsened with movement, no relieving factors.  No fevers or chills or further symptomatology.  Called her PCP with the above symptoms and instructed to present to the hospital for further workup and evaluation for her persistent pain as this has been going on intermittently for about 1 week in total duration.  Complaining only of pain as described above.  REVIEW OF SYSTEMS:  CONSTITUTIONAL:  Denies fever.  Positive for fatigue and weakness. EYES:  Denied blurred vision, double vision, eye pain. EARS, NOSE, AND THROAT:  Denies tinnitus, ear pain, hearing loss. RESPIRATORY: Denies coughing or shortness of breath. CARDIOVASCULAR: Denies chest pain, palpitations, or edema. GASTROINTESTINAL: Denies nausea, vomiting, diarrhea or abdominal pain. GENITOURINARY:  Denies dysuria or hematuria or change in urinary frequency. ENDOCRINE: Denies nocturia or thyroid problems. HEMATOLOGY AND LYMPHATIC: Denies easy bruising, bleeding. SKIN:  Denies rash or lesion. MUSCULOSKELETAL:  Denies pain in neck, back, shoulder, knees, hips, arthritic symptoms. NEUROLOGICAL:  He denies paralysis, paresthesia. PSYCHIATRIC:  Denies oppressive symptoms. Otherwise, full review of systems performed by me is negative.   PAST MEDICAL HISTORY:  Diabetes, coronary artery disease, COPD, history of paroxysmal atrial fibrillation, gastroesophageal reflux disease, hypertension, hypothyroidism.  SOCIAL HISTORY: Denies any tobacco, alcohol or drug  usage.  FAMILY HISTORY:  Positive for coronary artery disease as well as hypertension  ALLERGIES: Aspirin, Cipro, Darvon, Iv Contrast Dye, Niacin, Penicillin, Prednisone, Prilosec, Protonix, Sulfa Drugs, Tigan, and Valium.    HOME MEDICATIONS: Include acetaminophen hydrocodone 325 mg/5 mg 1 tablet daily as needed for pain, tramadol 50 mg every 4 hours as needed for pain, Flomax 0.4 mg p.o. daily, nitroglycerin 0.4 mg sublingual tablet every 5 minutes as needed for chest pain, lorazepam 1 mg half tablet to 1 tablet b.i.d. as needed for anxiety, promethazine 25 mg p.o. q. 6 hours as needed for nausea or vomiting, buspirone 10 mg p.o. b.i.d., oxazepam 15 mg p.o. at bedtime, rimantadine 300 mg p.o. b.i.d., Colace 200 mg p.o. at bedtime, lactulose 30 mL b.i.d. as needed for constipation, Senna Lax 8.6 mg 2 tablets at bedtime as needed for constipation, levothyroxine 125 mcg half tablet p.o. daily.  PHYSICAL EXAMINATION: VITAL SIGNS: Temperature of 98.3, heart rate 95, respirations 18, blood pressure 107/58, saturating 98% on room air, weight 78 kg, BMI 30.5.  GENERAL:  Chronically weak-appearing Caucasian female in no acute distress. HEAD:  Normocephalic, atraumatic. EYES:  Pupils equal, round, reactive to light.  Extraocular movements intact.  No scleral icterus.  MOUTH:  Moist mucous membranes.  Dentition intact.  No abscess noted. EARS, NOSE, AND THROAT:  Clear without exudates. No external lesion. NECK:  No thyromegaly or nodules.  No JVD.  PULMONARY: Clear to auscultation bilaterally without wheezes, rales, rhonchi.  No use of accessory muscles.  Good  respiratory rate. Chest nontender to palpation.  CARDIOVASCULAR: S1, S2. Regular rate and rhythm.  No murmurs, rubs, or gallops.  No edema.  Pedal pulses 2+ bilaterally. GASTROINTESTINAL: Soft, minimal tenderness.  Palpation to right suprapubic region without rebound or guarding. No motion tenderness.  Positive bowel sounds.  No hepatosplenomegaly.  MUSCULOSKELETAL:  No swelling,  clubbing, edema.  Range of motion full to all extremities. NEUROLOGICAL:  Cranial nerves II through XII intact.  No gross focal neurologic deficits.  Sensation intact. Reflexes intact. SKIN:  No ulcerations, lesions, or rashes. Skin warm and dry. Turgor intact. PSYCHIATRIC:  Mood and affect within normal limits.  Awake, alert, oriented x 3.  Insight and judgment intact.  LABORATORY DATA:  Sodium 137, potassium 3.9, chloride 109, bicarbonate of 20, anion gap of 8, BUN 15, creatinine 197, glucose 121.  WBC 6.7, hemoglobin 10.6, platelets 182,000.  Urinalysis:  WBC 10, RBC 3, leukocyte esterase trace, nitrite negative.  Epithelial cells 1.    RADIOLOGICAL DATA:  Renal ultrasound revealing right hydronephrosis with 9 mm and 8 mm renal calculi noted.    ASSESSMENT AND PLAN:  A 74 year old Caucasian female with history of diabetes, coronary artery disease, chronic obstructive pulmonary disease presenting with abdominal pain with known renal stones. 1. Acute kidney injury. IV fluid hydration.  Follow the urine output and renal functions. Question if this is post renal, Consult urology, given hydronephrosis. 2. Nephrolithiasis with right hydronephrosis. Urology consult, pain control.  Initiate bowel regimen. 3. Diabetes.  Hold p.o. agents and start insulin sliding scale.   4. Urinary tract infection.  Follow up cultures.  Continue with Levaquin given her allergies. 5. Gastroesophageal reflux disease.  Can give her Zantac. 6. Venous thromboembolism prophylaxis. Sequential compression devices. Marland Kitchen.  TIME SPENT:  45 minutes.   ____________________________ Cletis Athensavid K. Hower, MD dkh:dd D: 10/07/2013 22:42:00 ET T: 10/08/2013 01:47:19 ET JOB#: [id number]  cc: Cletis Athensavid K. Hower, MD, <Dictator> DAVID Synetta ShadowK HOWER MD ELECTRONICALLY SIGNED 10/09/2013 21:06

## 2014-08-24 NOTE — Consult Note (Signed)
PATIENT NAME:  Sharon Weiss, Sharon Weiss MR#:  [id number] DATE OF BIRTH:  1940/12/19  DATE OF CONSULTATION:  12/01/2013  CONSULTING PHYSICIAN:  Eward Rutigliano K. Rajvi Armentor, MD  SEX: Female.  HISTORY OF PRESENT ILLNESS: The patient was seen in consultation in room number 23 at [facility] Emergency Room. The patient is a 74 year old female who has [facility] for [facility]. Recently she had been agitated and running her mouth according to information obtained.  PAST PSYCHIATRIC HISTORY: History of dementia diagnosed many years ago and [facility].  ALCOHOL AND DRUGS: Denied.  MENTAL STATUS EXAMINATION: The patient was seen lying comfortable in bed. Alert but very confused. She stated that this is someplace and she is here for help and she got upset and irritable and requested for help. She knew that she was here for medication and things like that. Denies feeling depressed. Cognition is below average. General knowledge of information is below average. Does not appear to be having any ideas or plans to hurt herself, however. Insight and judgment impaired. Impulse control poor.  IMPRESSION: Dementia with psychosis.  RECOMMENDATION: Hospitalization for close observation, evaluation, and help and stabilization.   ____________________________ Jannet MantisSurya K. Guss Bundehalla, MD skc:lt D: 12/01/2013 19:34:13 ET T: 12/01/2013 22:28:51 ET JOB#: [id number]  cc: Monika SalkSurya K. Guss Bundehalla, MD, <Dictator> Beau FannySURYA K Charlye Spare MD ELECTRONICALLY SIGNED 12/02/2013 9:52

## 2014-08-24 NOTE — Consult Note (Signed)
Brief Consult Note: Diagnosis: Anxiety due to medical condition.   Patient was seen by consultant.   Recommend further assessment or treatment.   Comments: Sharon Weiss has no psychiatric past. She has been frequenting our ER complaining of abdominal pain. She was seen by Dr. Guss Bundehalla in consultation on 6/13. Today, she had 2 small loose BM in the morning. She had also one at midnight. Abdominal pain is less severe. However, she complains of urinary retention. There is a h/o renal calculi and new UTI treated today. She slept very little last night.    She is not suicidal or homicidal, just anxious and in pain. She moves very slowly and with difficulties. She lives by herself with a cat. She is uncertain if she can continue living independently.   PLAN: 1. The patient does not meet criteria for psychiatric admission.  2. We have been trying to contact her son who is back in town with no success.  3. No medications recommended beyond BuSpar.  4. SW consult for placement.  Electronic Signatures: Kristine LineaPucilowska, Jolanta (MD)  (Signed 16-Jun-15 13:02)  Authored: Brief Consult Note   Last Updated: 16-Jun-15 13:02 by Kristine LineaPucilowska, Jolanta (MD)

## 2014-08-24 NOTE — Consult Note (Signed)
PATIENT NAME:  Sharon Weiss, BRISSETTE MR#:  [id number] DATE OF BIRTH:  01-Aug-1940  DATE OF CONSULTATION:  11/19/2013  REFERRING PHYSICIAN:  Glennie Isle, MD CONSULTING PHYSICIAN:  Neetu Carrozza B. Nadirah Socorro, MD  REASON FOR CONSULTATION: To evaluate an anxious patient.   IDENTIFYING DATA: Sharon Weiss is a 74 year old female with no past psychiatric history except for anxiety causing her to return to Emergency Room frequently.   CHIEF COMPLAINT: They gave me wrong medications.  HISTORY OF PRESENT ILLNESS: I did see Sharon Weiss in the middle of June at which time the patient was in the Emergency Room for diarrhea and abdominal pain and was living at home independently. Since she has been placed in a nursing home, but is actually unhappy there. She believes that the staff gave her the wrong medicines, especially pain killers. She believes that she has been given narcotic pain killers which she refuses to take as they cause constipation and proper bowel movement is on her mind always. She also complains that sometimes pain medication is put in her food. She believes that she receives double doses of it and it makes her confused. She came to the Emergency Room again complaining of abdominal pain and inability to have a bowel movement. This is her sixth or seventh visit to the Emergency Room since the end of May. The patient has been started on BuSpar by her provider at the nursing home. She denies symptoms of depression. She does appear anxious and excessively worried, preoccupied with her function of the abdomen. She is complaining about constipation, even though she takes lactulose and appears to have regular bowel movements. A month ago when I saw her, I actually witnessed several bowel movements during my interview. The patient has been preoccupied with bowel movements all her life. Reportedly it stems from the preoccupation that her mother had with potty training. The patient is a retired Engineer, civil (consulting) so she should have  more insight into her problems. She has been complaining of bowel problems, especially with bowel movements for years, but now this is accompanied by pain. The patient denies any psychotic symptoms, but appears that her preoccupation and suspiciousness probably meets criteria for delusional disorder. Delusional disorder is notoriously difficult to treat and usually fixed delusions do not resolve even with most vigorous treatment. There is no history of substance use.   PAST PSYCHIATRIC HISTORY: She has never been hospitalized. No suicide attempt. She is treated with BuSpar for a couple of weeks now. She does not feel any different.   FAMILY PSYCHIATRIC HISTORY: None reported.   PAST MEDICAL HISTORY: Diabetes, coronary artery disease, COPD, atrial fibrillation, GERD, hypertension, hypothyroidism, history of renal calculi, constipation and intermittent diarrhea.   MEDICATIONS ON ADMISSION: Norco 5/325 daily as needed, tramadol 50 mg 3 times a day as needed, nitroglycerin 0.4 mg as needed for chest pain, lorazepam 1 tablet every 12 hours as needed, chlorpropamide 250 mg twice daily as needed, promethazine every 6 hours as needed, BuSpar 10 mg twice daily.   ALLERGIES: ASPIRIN, IRON, DARVON, ERYTHROMYCIN, IODINE, CHLORPROMAZINE, CIPRO, CODEINE.   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 She worked in multiple settings. 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 She used to live independently until 1 month ago when she was placed in a nursing home. She has a son but he works out of town quite a bit and not easy to get in touch with. I do not think that  he is the best advocate for the patient.   REVIEW OF SYSTEMS: CONSTITUTIONAL: No fevers or chills. Positive for fatigue.  EYES: No double or blurred vision.  ENT: No hearing loss.  RESPIRATORY: No shortness of breath or cough.  CARDIOVASCULAR: No chest pain or orthopnea.  GASTROINTESTINAL: No  abdominal pain, nausea, vomiting or diarrhea.  GENITOURINARY: No incontinence or frequency.  ENDOCRINE: No heat or cold intolerance.  LYMPHATIC: No anemia or easy bruising.  INTEGUMENTARY: No acne or rash.  MUSCULOSKELETAL: No muscle or joint pain.  NEUROLOGIC: No tingling or weakness.  PSYCHIATRIC: See history of present illness for details.  PHYSICAL EXAMINATION: VITAL SIGNS: Blood pressure 112/87, pulse 87, respirations 18, temperature 98.1.  GENERAL: This is an elderly female, anxious-appearing, in no acute distress. The rest of the physical examination is deferred to her primary attending.   LABORATORY DATA: Chemistries: Blood glucose 96, BUN 19, creatinine 1.49, sodium 133, potassium 4.1. Lipase 203. LFTs within normal limits. Troponin less than 0.02. CBC: White blood count 6.1, hemoglobin 11.5, hematocrit 34.3, platelets 198,000, MCV 105. Urinalysis is not suggestive of urinary tract infection.  MENTAL STATUS EXAMINATION: The patient is alert and oriented to person, place, time and situation, although she accuses her nursing home of giving her wrong medicines that they put in food She maintains good eye contact. She is pleasant, polite and cooperative. She is adequately groomed, wearing a hospital gown. Her speech is of normal rhythm, rate and volume, rather soft. Mood is anxious with full affect. Thought process is logical and goal oriented, but influenced by delusions. She denies thoughts of hurting herself or others. There are no auditory or visual hallucinations. Her cognition is grossly intact. Her short and long-term memory are intact. She is of above average intelligence and fund of knowledge. Her insight and judgment may be questionable.   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  AXIS II: Deferred.  AXIS III: Hypertension, diabetes, coronary artery disease, chronic obstructive pulmonary disease, atrial  fibrillation, gastroesophageal reflux disease, hypothyroidism, renal calculi, constipation, intermittent diarrhea.  AXIS IV: Physical illness, change of way of life.  AXIS V: Global assessment of functioning 55.   PLAN:  1.  The patient does not meet criteria for psychiatric admission.  2.  We will add Luvox for anxiety/obsessive compulsive disorder type of symptoms and low-dose of Risperdal for delusions. Prescriptions were given.  3.  She is to continue BuSpar. 4.  Social work consult to return the patient to her facility.   ____________________________ Ellin GoodieJolanta B. Jennet MaduroPucilowska, MD jbp:sb D: 11/19/2013 16:41:26 ET T: 11/19/2013 17:22:47 ET JOB#: [id number]  cc: Climmie Buelow B. Jennet MaduroPucilowska, MD, <Dictator> Shari ProwsJOLANTA B Albin Duckett MD ELECTRONICALLY SIGNED 11/22/2013 7:25

## 2014-08-24 NOTE — H&P (Signed)
PATIENT NAME:  Sharon Weiss, CORDIAL MR#:  [id number] DATE OF BIRTH:  12/03/1940  DATE OF ADMISSION:  08/10/2013  REFERRING PHYSICIAN:  Dr. Fanny Bien.   PRIMARY CARE PHYSICIAN:  Dr. Leotis Shames of [facility].   CHIEF COMPLAINT:  Abdominal pain.   HISTORY OF PRESENT ILLNESS:  A 74 year old Caucasian female with past medical history of coronary artery disease, COPD, atrial fibrillation, gastroesophageal reflux disease, hypertension, hypothyroidism, irritable bowel syndrome, constipation presenting with abdominal pain.  She describes four day duration of abdominal pain, mainly periumbilical, epigastric in location, 10 out of 10 in intensity, cramping and twisting in quality, no worsening or relieving factors, nonradiating.  She has associated constipation, states that she has not had a bowel in slightly greater than 24 hours which is abnormal for her as well as associated nausea, vomiting.  She describes nonbloody though possibly bilious emesis on a few accounts.  She however has not had any decreased change in her by mouth intake.  Aside from the above symptoms she is also complaining of chest pain/epigastric pain, which is intermittent, however she is straining to use the restroom.  Currently, she is complaining only of abdominal pain, relieved somewhat with pain medication.   REVIEW OF SYSTEMS:  CONSTITUTIONAL:  Denies fever, fatigue, weakness.  EYES:  Denies blurred vision, double vision, eye pain.  EARS, NOSE, THROAT:  Denies tinnitus, ear pain, hearing loss.  RESPIRATORY:  Denies cough, wheeze, shortness of breath.  CARDIOVASCULAR:  Positive for chest pain as described above.  Denies any palpitations or edema.  GASTROINTESTINAL:  Positive for nausea, vomiting, constipation.  Abdominal pain as described above.  GENITOURINARY:  Denies dysuria, hematuria.  ENDOCRINE:  Denies nocturia, thyroid problems. HEMATOLOGY AND LYMPHATIC:  Denies easy bruising, bleeding.  SKIN:  Denies rash or lesion.   MUSCULOSKELETAL:  Denies pain in neck, back, shoulder, knees, hips or arthritic symptoms.  NEUROLOGIC:  Denies paralysis, paresthesias.  PSYCHIATRIC:  Denies anxiety or depressive symptoms.  Otherwise, full review of systems performed by me is within normal limits.    PAST MEDICAL HISTORY:  Diabetes, coronary artery disease, COPD, atrial fibrillation, gastroesophageal reflux disease, hypertension, hypothyroidism.   SOCIAL HISTORY:  Denies any alcohol, tobacco or drug usage.   FAMILY HISTORY:  Positive for coronary artery disease and hypertension.   ALLERGIES:  ASPIRIN, CIPRO, DARVON, CONTRAST DYE, NIACIN, PENICILLIN, PREDNISONE, PRILOSEC, PROTONIX, SULFA DRUGS, TIGAN, VALIUM  HOME MEDICATIONS:  Acetaminophen hydrocodone 325/5 mg 1-1/2 tabs every eight hours as needed for pain, tramadol 50 mg every four hours as needed for pain, Tylenol extra strength 500 mg 2 tabs every 4 to 6 hours as needed for pain, nitroglycerin 0.4 mg sublingual every five minutes up to the three doses as needed for chest pain, lorazepam 1 mg 1 tab twice daily as needed for anxiety, promethazine 25 mg every six hours as needed for nausea, vomiting, BuSpar 10 mg by mouth twice daily, oxazepam 15 mg by mouth at bedtime, Bentyl 20 mg by mouth 3 times daily, ranitidine 300 mg by mouth twice daily, Colace 200 mg by mouth at bedtime, Colace 50 mg by mouth twice daily, Ex-Lax 4 tablets by mouth daily, lactulose 30 mL by mouth twice daily, Synthroid 125 mcg half tablet daily.   PHYSICAL EXAMINATION: VITAL SIGNS:  Temperature 97.6, heart rate 80, respirations 20, blood pressure 115/60, saturating 94% on room air.  Weight 78 kg, BMI of 30.5.  GENERAL:  Well-nourished, well-developed, Caucasian female, currently in no acute distress.  HEAD:  Normocephalic, atraumatic.  EYES:  Pupils equal, round, reactive to light.  Extraocular muscles intact.  No scleral icterus.  MOUTH:  Moist mucous membranes.  Dentition intact.  No abscess noted.   EARS, NOSE, THROAT:  Throat clear without exudates.  No external lesions.  NECK:  Supple.  No thyromegaly.  No nodules.  No JVD.  PULMONARY:  Clear to auscultation bilaterally without wheezes, rubs, or rhonchi.  No use of accessory muscles.  Good respiratory effort.  CHEST:  Nontender to palpation.  CARDIOVASCULAR:  S1, S2, regular rate and rhythm.  No murmurs, rubs or gallops.  No edema.  Pedal pulses 2+ bilaterally.  GASTROINTESTINAL:  Soft, periumbilical tenderness.  No rebound, guarding or motion tenderness, nondistended.  No masses, hyperactive bowel sounds.  No hepatosplenomegaly.  MUSCULOSKELETAL:  No swelling, clubbing, edema.  Range of motion full in all extremities.  NEUROLOGIC:  Cranial nerves II through XII intact.  No gross focal neurological intact.  Sensation intact.  Reflexes intact.  SKIN:  No ulcerations, lesions, rash, cyanosis.  Skin warm and dry.  Turgor intact.  PSYCHIATRIC:  Mood and affect within normal limits.  The patient is alert, awake, oriented x 3.  Insight and judgment intact.   LABORATORY DATA:  Sodium 142, potassium 3.1, chloride 110, bicarb 25, BUN 15, creatinine 1.7, glucose 128.  LFTs within normal limits.  Troponin I less than 0.02.  WBC 6.9, hemoglobin 11.8, platelets of 195.  INR of 1.  CT abdomen and pelvis performed without contrast, bowel gas pattern reflective of mild ileus or gastroenteritis.  No colitis, enteritis or diverticulitis.  No perforation.  Chest x-ray performed, no acute cardiopulmonary process.  EKG performed with normal sinus rhythm.  No ST or T wave abnormalities.   ASSESSMENT AND PLAN:  A 74 year old Caucasian female presenting with abdominal pain.  1.  Abdominal pain, possible ileus.  The patient is nothing by mouth.  Advance diet as tolerated.  Initiate bowel regimen.  Discussed the usage of nasogastric tube if symptoms persist and/or uncontrolled.  2.  Chest pain/epigastric pain.  This is unlikely cardiac in nature.  However given  cardiac history we will trend cardiac enzymes and place on cardiac telemetry.  3.  Hypothyroidism.  Check a TSH and Synthroid as this could be a component of her constipation.  4.  Hypokalemia.  Replace potassium.  We will check magnesium level.  Goal potassium level of 4, magnesium level of 2.  5.  Venous thromboembolism prophylaxis with sequential compression devices.  6.  CODE STATUS:  THE PATIENT IS A FULL CODE.   TIME SPENT:  45 minutes.     ____________________________ Cletis Athensavid K. Aaralyn Kil, MD dkh:ea D: 08/10/2013 23:37:00 ET T: 08/11/2013 00:18:38 ET JOB#: [id number]  cc: Cletis Athensavid K. Annabel Gibeau, MD, <Dictator> Jarrah Seher Synetta ShadowK Jayme Mednick MD ELECTRONICALLY SIGNED 08/11/2013 1:54

## 2014-08-24 NOTE — Consult Note (Signed)
PATIENT NAME:  Sharon Weiss, Sharon Weiss MR#:  [id number] DATE OF BIRTH:  [birth year]  DATE OF CONSULTATION:  10/16/2013  REFERRING PHYSICIAN:  Briant Sites. Joni Fears, MD CONSULTING PHYSICIAN:  Jolanta B. Pucilowska, MD  REASON FOR CONSULTATION: To evaluate an anxious patient.   IDENTIFYING DATA: Sharon Weiss is a 74 year old female with no past psychiatric history.   CHIEF COMPLAINT: "My belly hurts."   HISTORY OF PRESENT ILLNESS: Sharon Weiss has been frequenting the Emergency Room lately. She came to the hospital on June 7th and was briefly hospitalized for kidney stones, then on the 11th, 12th, 14th and twice on June 15th.  Every time she complains of abdominal pain, feels better if discharged to home and returns to the hospital almost immediately with complaint  of abdominal pain, constipation and urinary retention. The patient was in the Emergency Room 2 days ago, seen by Dr. Franchot Mimes, who did not give her any psychiatric diagnosis except for anxiety related to her medical condition. She was discharged on the 15th from the Emergency Room at noon.  She was provided information about psychiatric crisis line. Her son picked her up from the Emergency Room, dropped her off at home. The patient started calling crisis line complaining of her abdomen and diarrhea almost immediately and ended up coming to the hospital again. I met with her extensively on the 15th. The patient had 2 loose green bowel movements during the interview. She complained of abdominal pain and was unable to pee. Earlier today, she was diagnosed with a urinary tract infection and was given a dose of fosfomycin. The patient is in physical distress. She is also rather frail, unsteady on her feet and probably at risk of falling. She denies any symptoms of depression, anxiety or psychosis even though she appears anxious. She was prescribed several medications during her recent hospitalization for kidney stones including Ativan. She feels that she likes it  too much and refuses to take it. She is also prescribed oxazepam for sleep at night and this is her only psychiatric symptom, difficulty sleeping. This is partly related to her anxiety but partly to her abdominal pain and diarrhea. She fears that she would not make it in time to the bathroom.  This  diarrhea is intermittent. Most of the time the patient suffers constipation and she has been preoccupied with bowel movements pretty much all her life, as was her mother before her and the family was well trained on a potty. The patient denies any alcohol or illicit substance use.   PAST PSYCHIATRIC HISTORY: None. She never been hospitalized. No suicide attempt. She does not see a psychiatrist currently.  FAMILY PSYCHIATRIC HISTORY: None reported.   PAST MEDICAL HISTORY: Diabetes, coronary artery disease, chronic obstructive pulmonary disease, paroxysmal atrial fibrillation, gastroesophageal reflux disease, hypertension, hypothyroidism, renal calculi.   MEDICATIONS ON ADMISSION: Hydrocodone 5 mg 1/2 to 1 tablet as needed for pain, tramadol 50 mg every 8 hours as needed, nitroglycerin 0.4 mg up to 3 doses as needed, lorazepam 1/2 to 1 tablet 1 mg every 12 hours as needed, promethazine as needed, BuSpar 10 mg twice daily, oxazepam 15 mg at bedtime, Bentyl 20 mg 4 times daily, Zantac 300 mg twice daily, Ex-Lax 4 tablets once a day as needed, lactulose 30 mL twice daily as needed, senna 8.6 mg at bedtime, Synthroid 62.5 mcg daily.   SOCIAL HISTORY: She lives alone with a cat in an apartment. Her son, who is helping her, frequently works out of town. Apparently, he just  returned to [facility], picked the patient up from the hospital at noon on the 15th, dropped her off at her house and disappeared. She was not able to contact by phone. The patient is uncertain whether or not she can manage her affairs alone at home and, at least in talking to me today, she would consider placement. I believe that her son should be  helping her to make such decisions. She does not have Medicaid. She does not appear to be demented.    REVIEW OF SYSTEMS: CONSTITUTIONAL: No fevers or chills. Positive for fatigue.  EYES: No double or blurred vision.  ENT: No hearing loss.  RESPIRATORY: No shortness of breath or cough.  CARDIOVASCULAR: No chest pain or orthopnea.  GASTROINTESTINAL: Positive for abdominal pain, diarrhea and constipation.  GENITOURINARY: Positive for frequency and difficulty urinating.  ENDOCRINE: No heat or cold intolerance.  LYMPHATIC: No anemia or easy bruising.  INTEGUMENTARY: No acne or rash.  MUSCULOSKELETAL: No muscle or joint pain.  NEUROLOGIC: No tingling or weakness.  PSYCHIATRIC: See history of present illness for details.   PHYSICAL EXAMINATION: VITAL SIGNS: Blood pressure 146/82, pulse 88, respirations 18, temperature 98.7.  GENERAL: This is a slender, elderly female, anxious-appearing.  The rest of the physical examination is deferred to her primary attending.   LABORATORY DATA: Chemistries are within normal limits except for blood glucose of 125, potassium 3.2. Normal lipase. LFTs within normal limits. Cardiac enzymes negative. CBC within normal limits, white blood count 6.7, hemoglobin 10.6, hematocrit 32.9, platelets 178. She is Clostridium difficile negative and urine culture is also negative. Urinalysis is suggestive of urinary tract infection with trace leukocyte esterase and 76 red cells per field, 25 white cells.   MENTAL STATUS EXAMINATION: The patient is alert and oriented to person, place, time and situation. She is in physical distress. She maintains good eye contact. She is adequately groomed, wearing hospital gown. She complains of feeling claustrophobic and does not allow me to pull the curtain or close the door, even if she is on the potty. Her speech is of normal rhythm, rate and volume. Mood is anxious with full affect. Thought process is logical and goal oriented. Thought  content: She denies thoughts of hurting herself or others. There are no delusions or paranoia. There are no auditory or visual hallucinations. Her cognition is grossly intact. her registration, recall, short and long-term memory are intact. She seems of average intelligence and fund of knowledge. Her insight and judgment may be questionable.   DIAGNOSES: AXIS I: Anxiety secondary to medical condition.  AXIS II: Deferred.  AXIS III: Hypertension, urinary tract infection, renal calculi, constipation, diarrhea.  AXIS IV: Physical decline, primary support.  AXIS V: Global assessment of functioning 55.   PLAN:  1.  The patient does not meet criteria for involuntary inpatient psychiatric commitment or psychiatric admission at all. Please discharge as appropriate.  2.  No new suggestions for medications.  3.  I will ask case management for a consult.  I am not sure how safe this patient is home alone. 4.  Psychiatry will follow along.    ____________________________ Wardell Honour. Bary Leriche, MD jbp:cs D: 10/16/2013 14:53:14 ET T: 10/16/2013 15:25:46 ET JOB#: [id number]  cc: Jolanta B. Bary Leriche, MD, <Dictator> Clovis Fredrickson MD ELECTRONICALLY SIGNED 11/14/2013 4:45

## 2014-08-24 NOTE — Consult Note (Signed)
PATIENT NAME:  Sharon Weiss, Sharon Weiss MR#:  [id number] DATE OF BIRTH:  [birth year]  DATE OF CONSULTATION:  10/13/2013  REFERRING PHYSICIAN:   CONSULTING PHYSICIAN:  Sunshyne Horvath K. Guss Bundehalla, MD  PLACE OF DICTATION:  [facility] [place], room [address].  AGE:  74 years.  SEX:  Female.  RACE:  White.  SUBJECTIVE:  The patient was seen in consultation at [facility].  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  The patient is divorced for many years and has been living in [place].  She has one son that lives in [place] and came to be with him.  The patient lives by herself in an apartment that she takes care of herself and takes care of her cat.  Currently she is worried about the cat because she is here.  The patient was brought to the Emergency Room for "bizarre behavior" and she came to the Emergency Room twice in 12 hours.  When the patient was asked the reason she was here she reported she has abdominal pain and she has kidney stones in her bladder and kidneys and this causes her to have pain.    PAST PSYCHIATRIC HISTORY:  No history of suicide attempt.  Not being followed by any psychiatrist.   ALCOHOL AND DRUGS:  Denied.  MENTAL STATUS:  The patient is alert and oriented to place, person and time.  Affect is appropriate with her mood which is stable.  She denies feeling depressed.  Denies feeling hopeless or helpless.  Denies feeling worthless or useless.  Good historian.  She reports that she feels sad because she cannot spend enough time with her grandchildren and she has 4 of them from her one son and smiled while talking about the same.  No psychosis.  Denies auditory or visual hallucinations.  Denies hearing voices or seeing them.  Cognition intact.  General fund of knowledge of information fair.  Denies suicidal or homicidal plans.  Contracts for safety.  She  pointed to her abdomen, stated that she has kidney stones and bladder stones which cause her to have distress and she wanted to get it checked out.  Insight and judgment fair.   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    RECOMMEND:  Discharge the patient to go home and her son will come pick her up and then she can get followup appointment for her kidney stones and she agreed to the same.    ____________________________ Jannet MantisSurya K. Guss Bundehalla, MD skc:ea D: 10/13/2013 19:12:55 ET T: 10/13/2013 23:38:36 ET JOB#: [id number]  cc: Monika SalkSurya K. Guss Bundehalla, MD, <Dictator> Beau FannySURYA K Dayra Rapley MD ELECTRONICALLY SIGNED 10/14/2013 17:18

## 2015-06-16 IMAGING — CR DG ABDOMEN 3V
1 series · 4 of 4 positions shown · non-contrast
Comparison: Abdominal pelvic CT 10/12/2013. Acute abdominal series
on 08/10/2013

CLINICAL DATA: Low abdominal pain for 3 weeks. History of
obstruction and kidney stones.

EXAM:
ABDOMEN SERIES

[Series 25: w chest pa · 0.14mm/px · 4 of 4 slices shown]
[im 1/4]
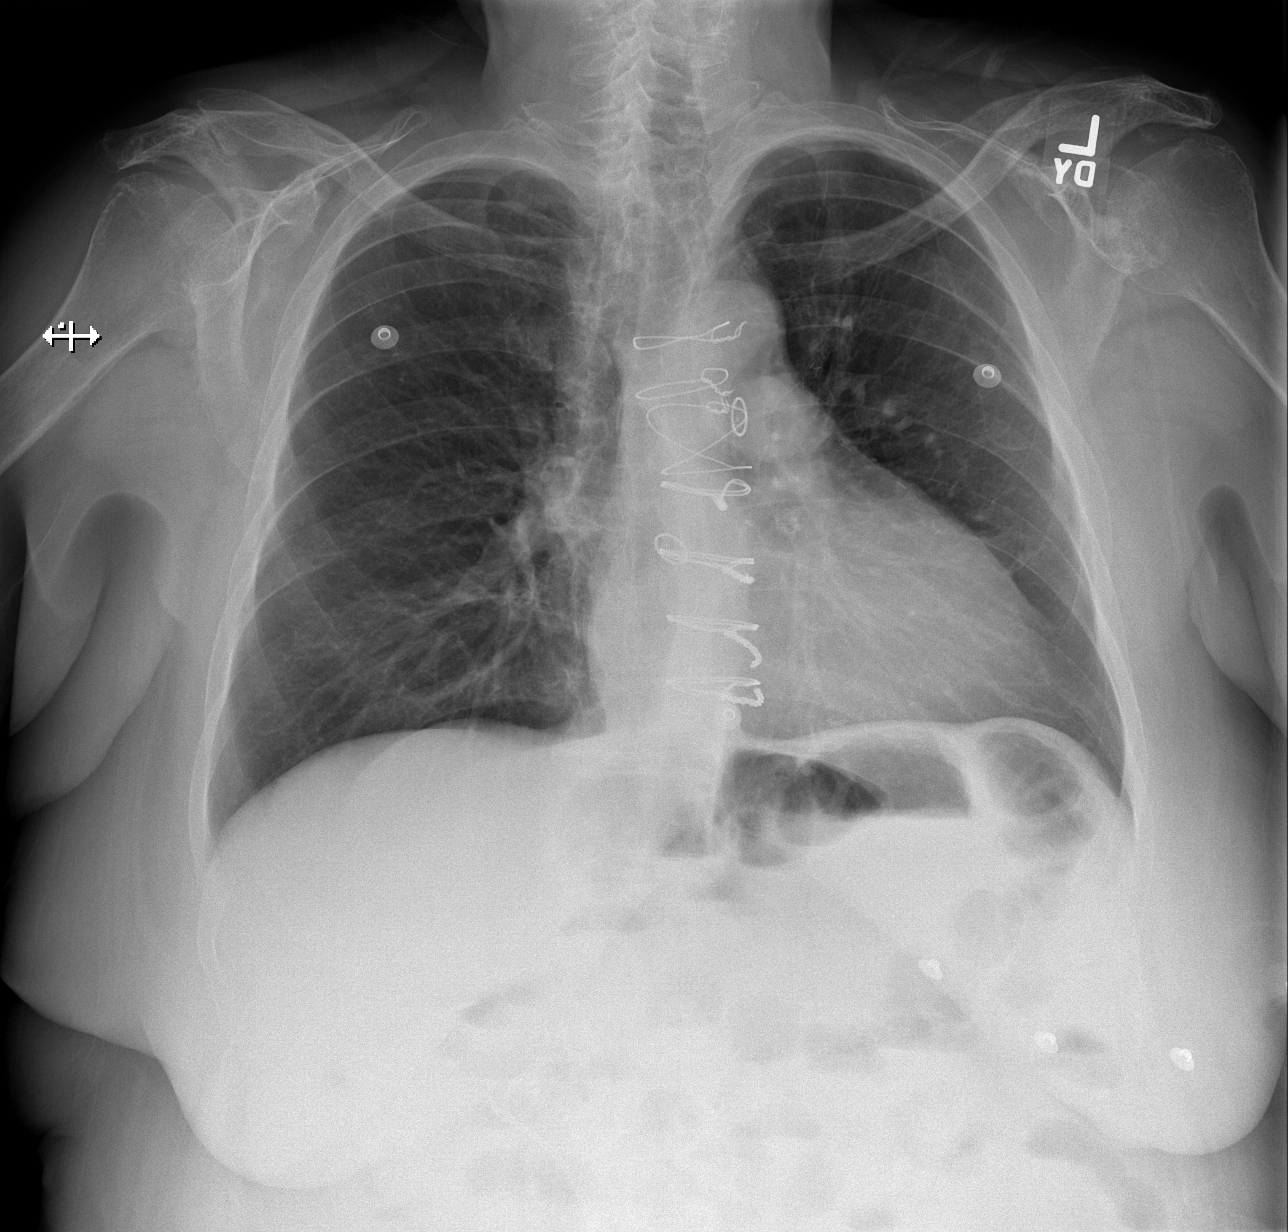
[im 2/4]
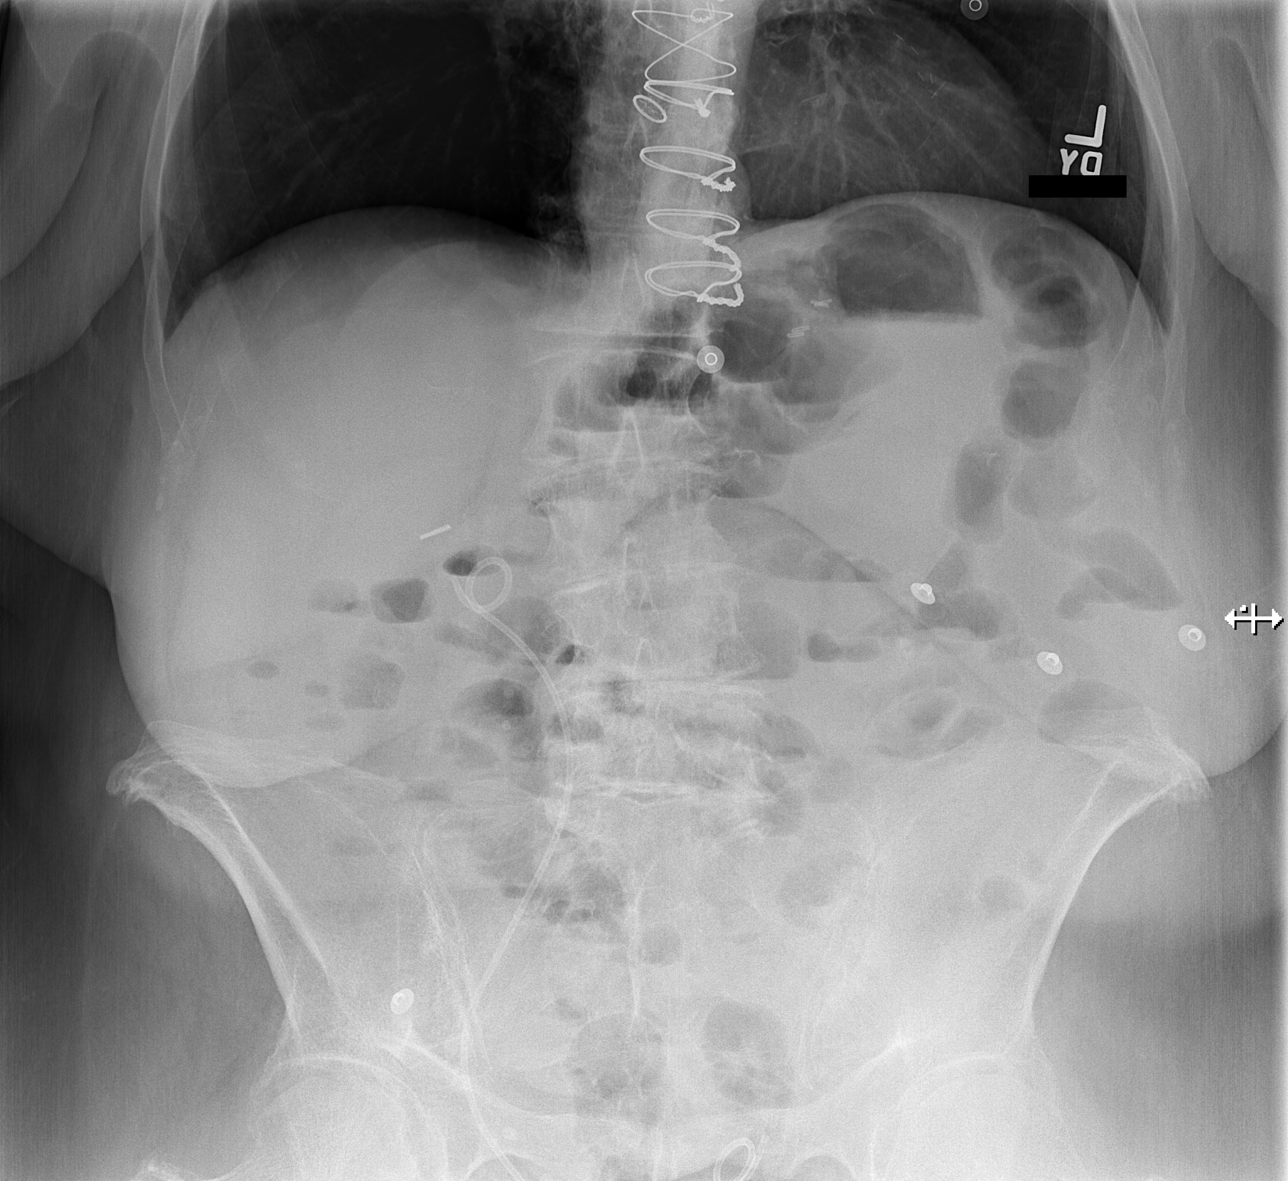
[im 3/4]
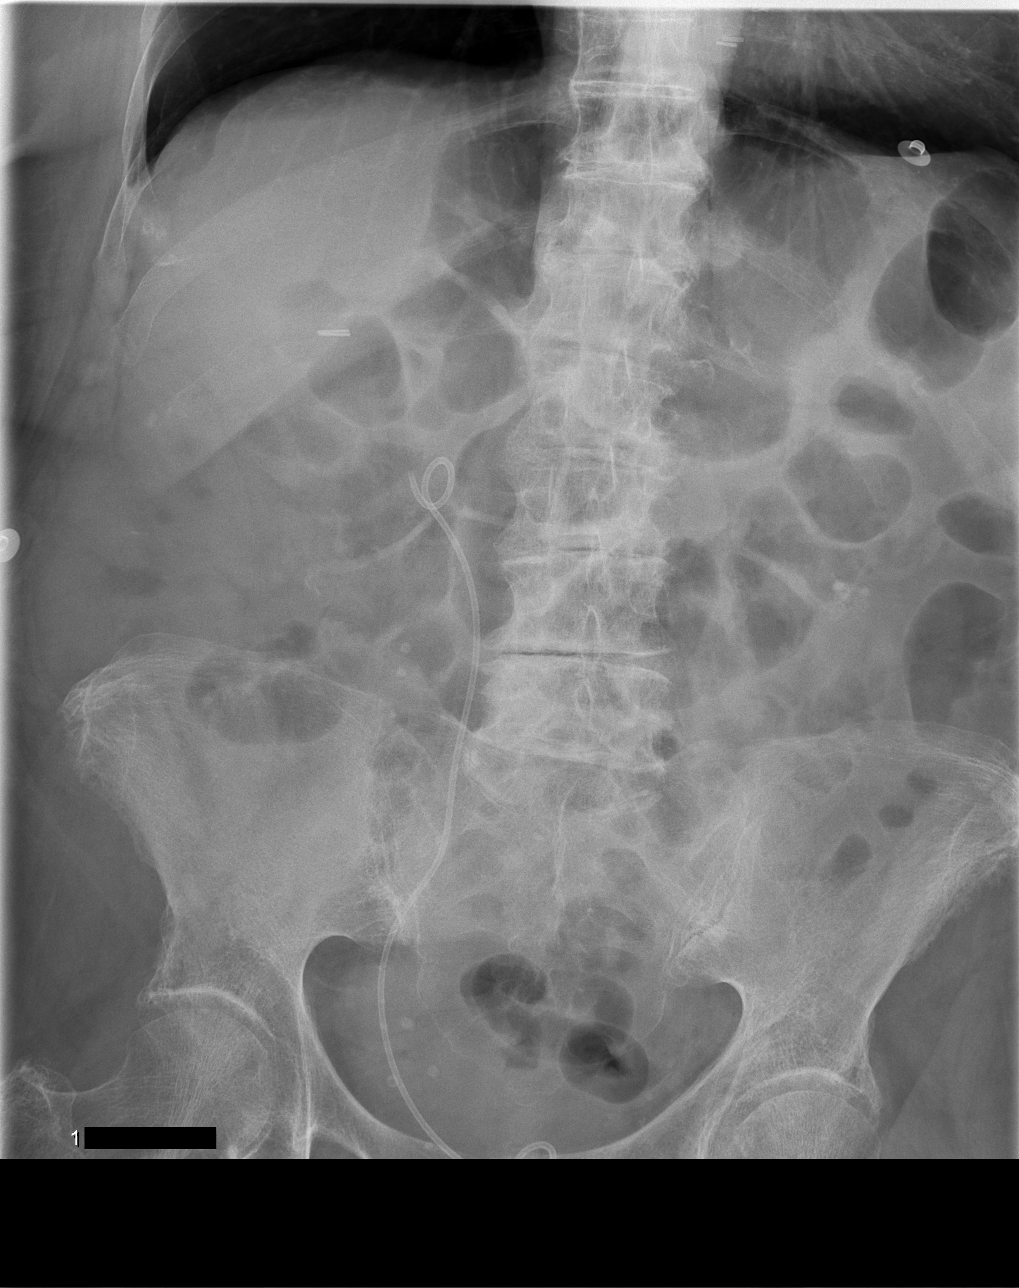
[im 4/4]
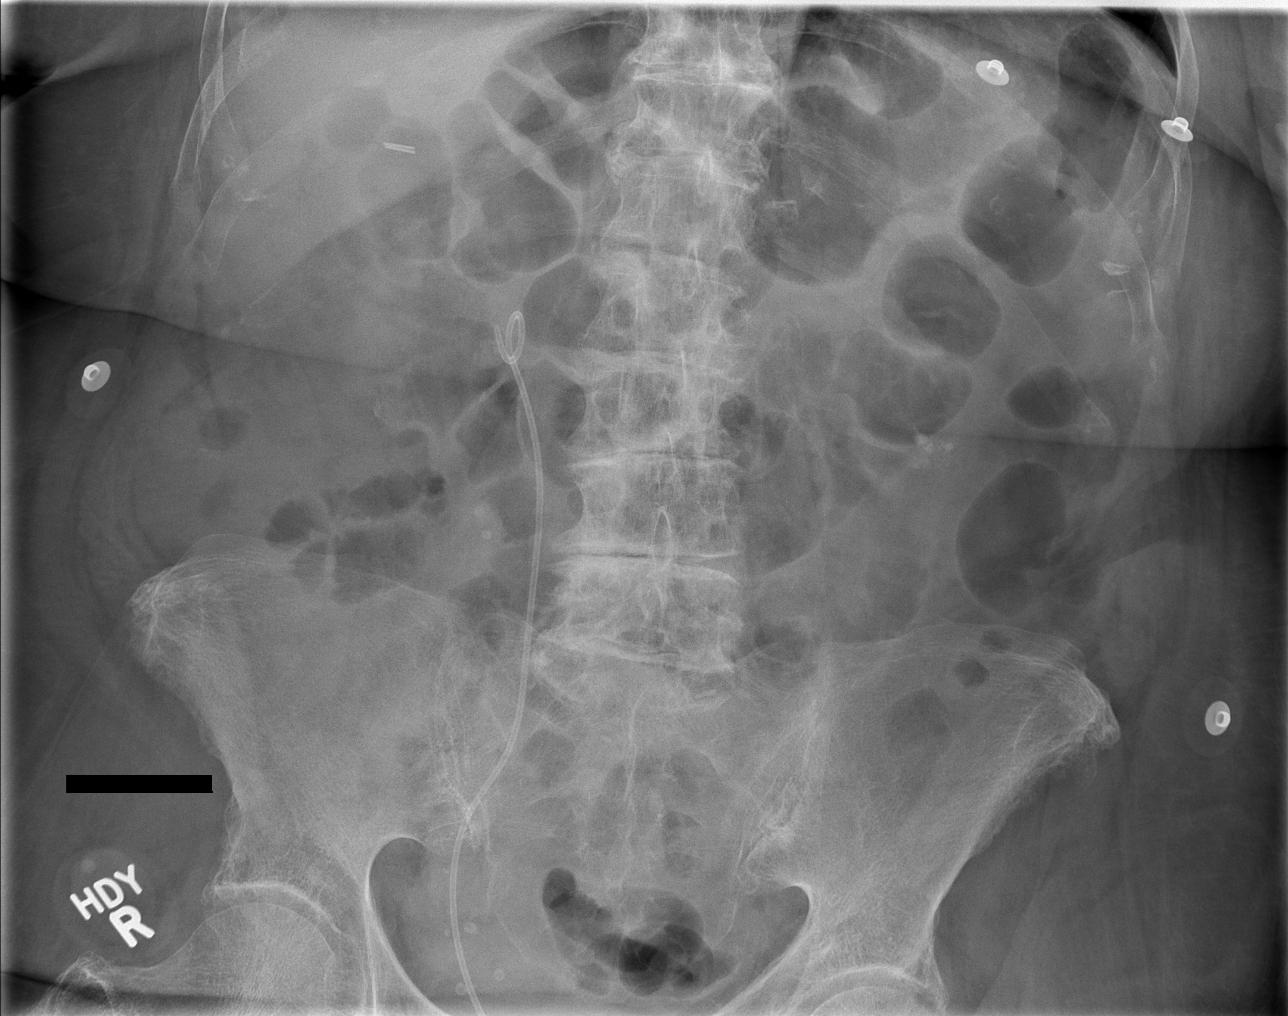

[4 of 4 positions shown; findings below may reference images not displayed]

FINDINGS: The heart size and mediastinal contours are stable status post
median sternotomy and CABG. The lungs are clear. There is no pleural
effusion or pneumothorax.

Double-J right ureteral stent remains in place. Left renal calculi
and scattered phleboliths appears stable. The bowel gas pattern is
nonobstructive. There are scattered air-fluid levels within
nondistended small bowel and colon. There is no free intraperitoneal
air.

The bones are diffusely demineralized. There are degenerative
changes throughout the thoracolumbar spine.
IMPRESSION: No acute cardiopulmonary or abdominal process demonstrated. The
bowel gas pattern is consistent with a mild ileus.

## 2015-06-16 IMAGING — CT CT ABD-PELV W/O CM
2 of 4 series · 16 of 46 positions shown, 18 images · non-contrast
Comparison: CT of the abdomen and pelvis from 10/12/2013, and
abdominal radiograph performed earlier today at [DATE] p.m.

CLINICAL DATA: Abdominal pain and constipation. Air-fluid levels
noted within small and large bowel loops on abdominal radiograph.

EXAM:
CT ABDOMEN AND PELVIS WITHOUT CONTRAST
TECHNIQUE: Multidetector CT imaging of the abdomen and pelvis was performed
following the standard protocol without IV contrast.

[Series 2: routine abd pel without · axial · non-contrast · 0.95mm/px · z∈[-997,-597]mm · 13 of 88 slices shown, 15 images]
[im 4/88  soft-tissue]
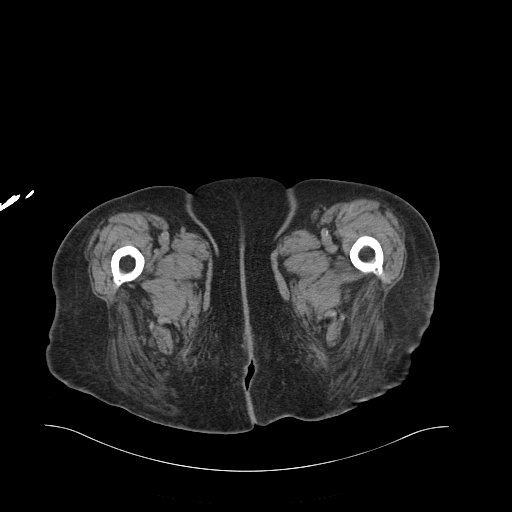
[im 4/88  bone]
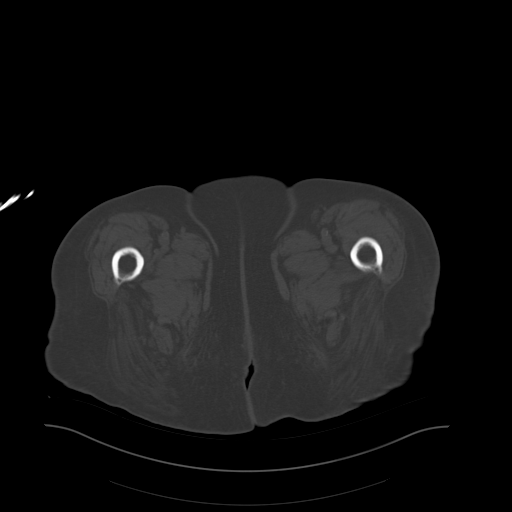
[im 11/88  soft-tissue]
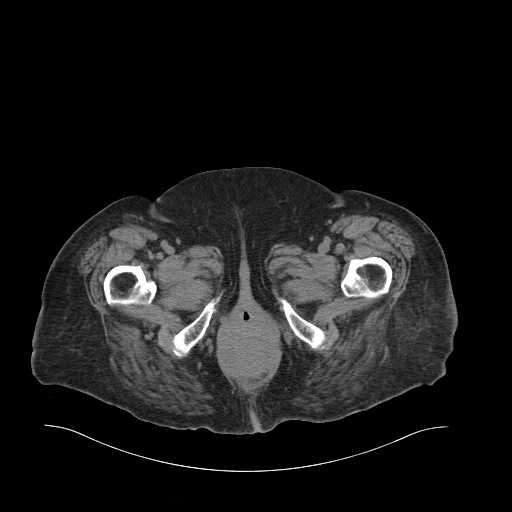
[im 19/88  soft-tissue]
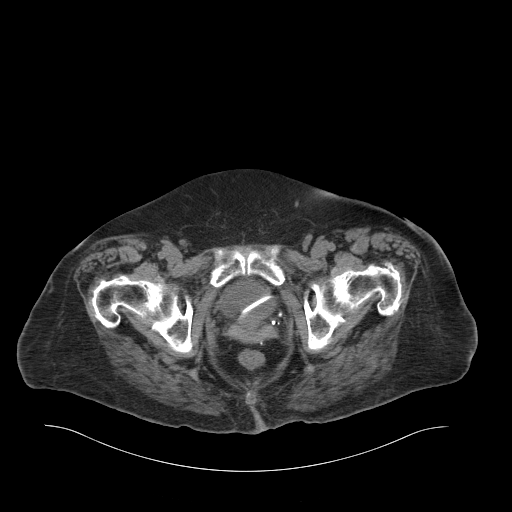
[im 26/88  soft-tissue]
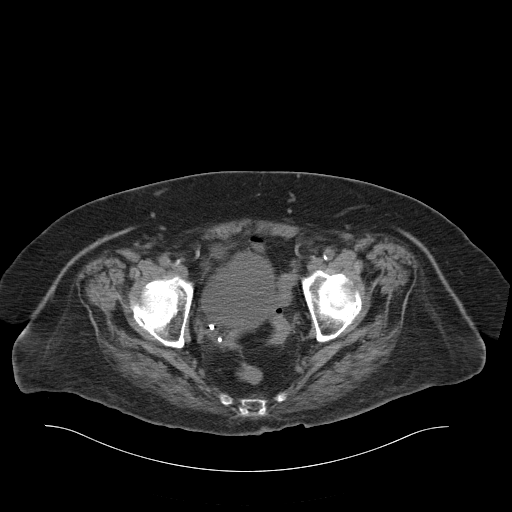
[im 30/88  soft-tissue]
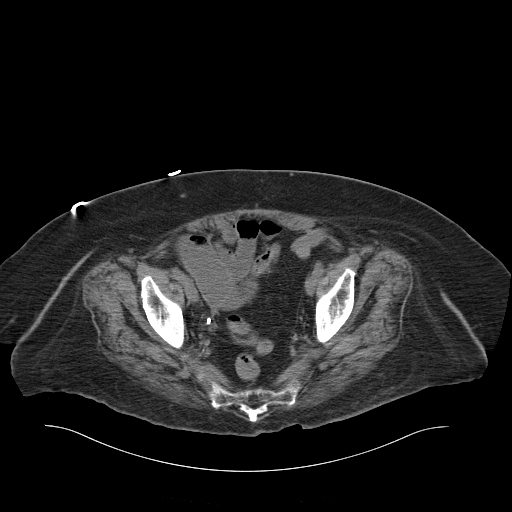
[im 37/88  soft-tissue]
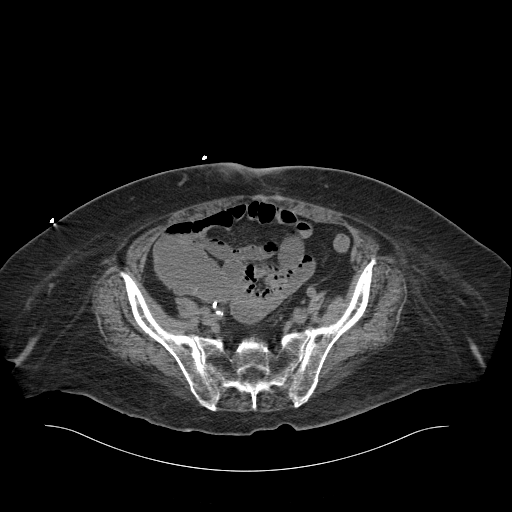
[im 44/88  soft-tissue]
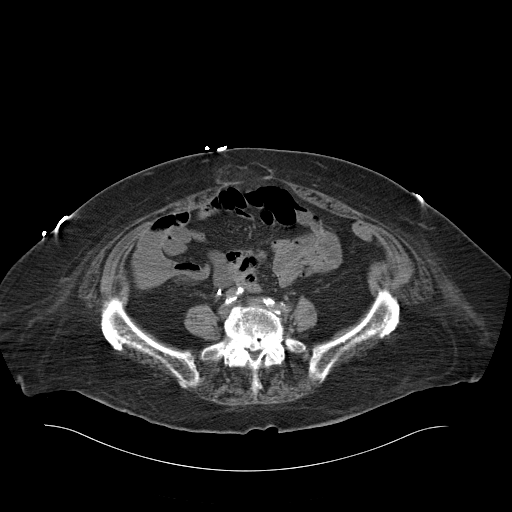
[im 51/88  soft-tissue]
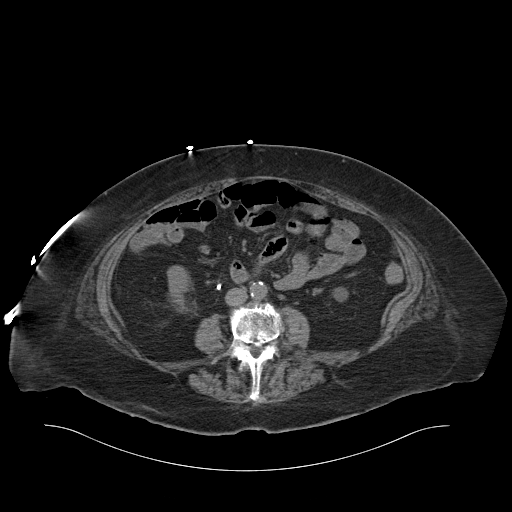
[im 59/88  soft-tissue]
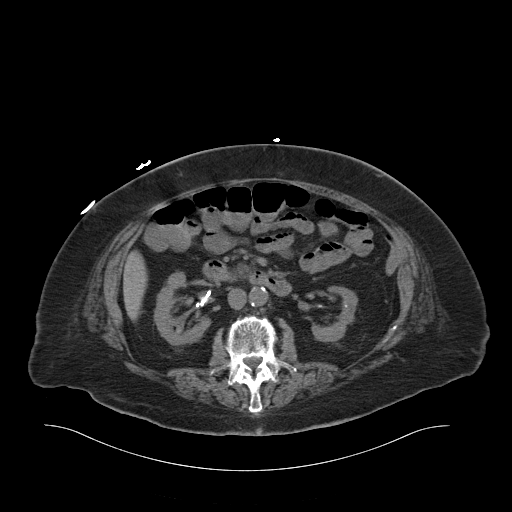
[im 59/88  bone]
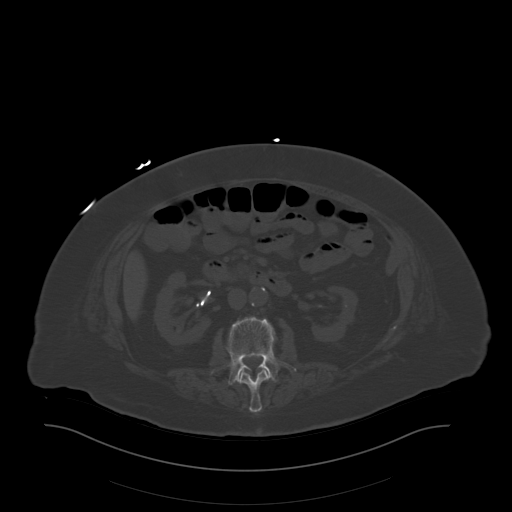
[im 62/88  soft-tissue]
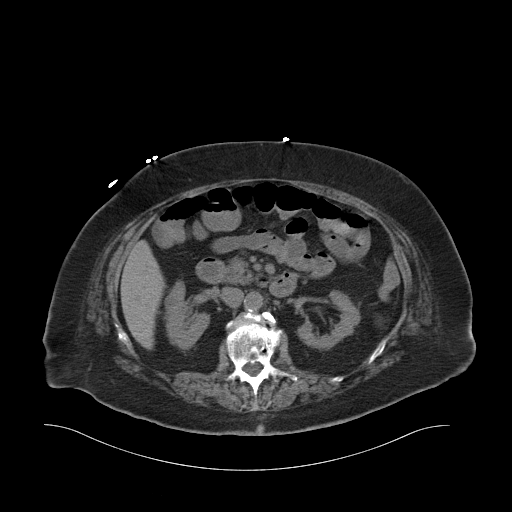
[im 69/88  soft-tissue]
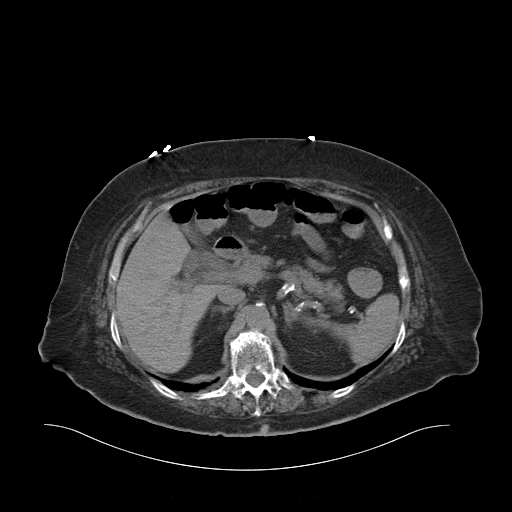
[im 77/88  soft-tissue]
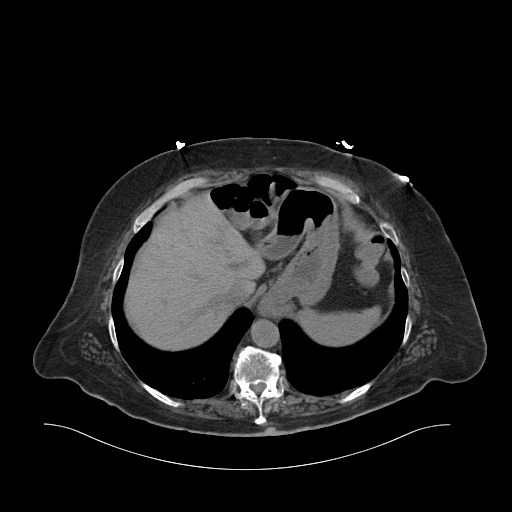
[im 84/88  soft-tissue]
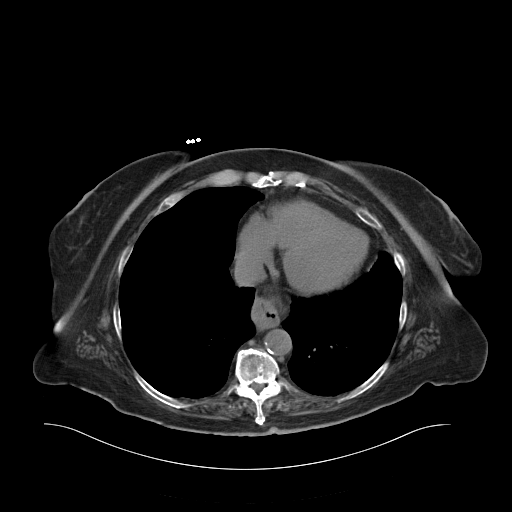

[Series 5: cor routine abd pel wo · coronal · 0.89mm/px · 3 of 127 slices shown]
[im 43/127  soft-tissue]
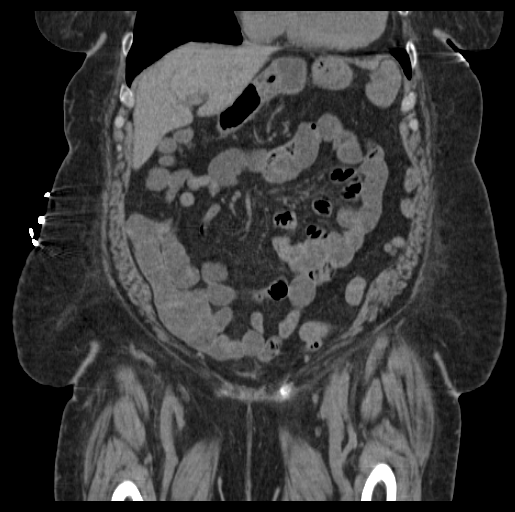
[im 57/127  soft-tissue]
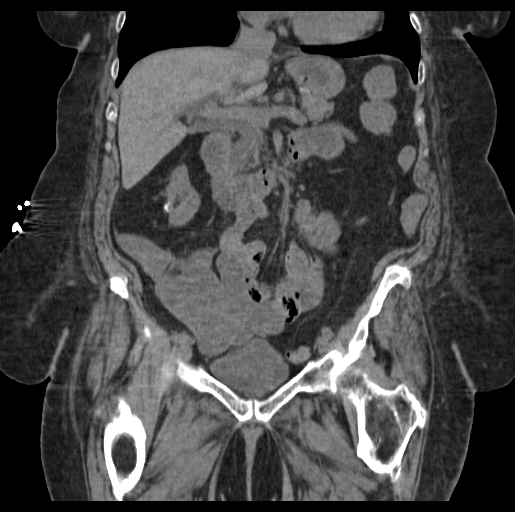
[im 71/127  soft-tissue]
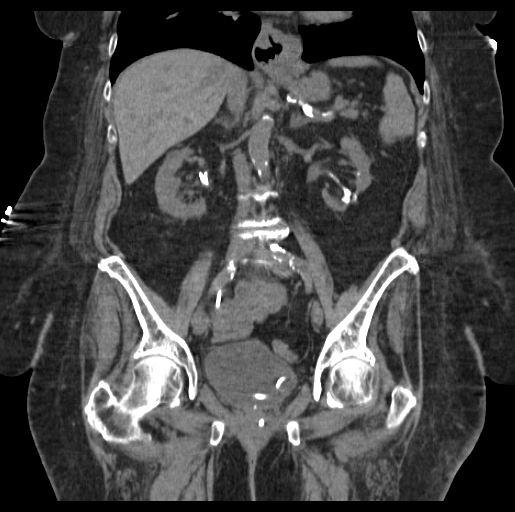

[16 of 46 positions shown; findings below may reference images not displayed]

FINDINGS: The visualized lung bases are clear. Scattered coronary artery
calcifications are seen. The patient is status post median
sternotomy. A small hiatal hernia is seen.

The liver and spleen are unremarkable in appearance. The patient is
status post cholecystectomy, with a clip noted at the gallbladder
fossa. The pancreas and adrenal glands are unremarkable.

Mild bilateral renal atrophy is noted. Scattered parenchymal
calcifications and scarring are seen bilaterally. A right-sided
ureteral stent is noted in expected position, without evidence of
hydronephrosis. There is suggestion of a few small left renal stones
at the left renal calyces, with a clump measuring up to 6 mm in
size.

A small to moderate periumbilical hernia is noted, containing only
fat, with minimal soft tissue stranding.

No free fluid is identified. The small bowel is partially filled
with fluid and air, and is grossly unremarkable. There is no
evidence of abnormal dilatation of bowel. The stomach is within
normal limits. No acute vascular abnormalities are seen.

The appendix is not definitely characterized; there is no evidence
for appendicitis. The colon is partially filled with fluid and is
unremarkable in appearance. The transverse colon is mildly
redundant.

The bladder is mildly distended and otherwise unremarkable in
appearance. The patient is status hysterectomy. No suspicious
adnexal masses are seen. No inguinal lymphadenopathy is seen.

No acute osseous abnormalities are identified. Multilevel vacuum
phenomenon and disc space narrowing are seen along the lumbar spine.
IMPRESSION: 1. No acute abnormalities seen to explain the patient's symptoms.
2. The small and large bowel are partially filled with fluid, and
unremarkable in appearance. No evidence for bowel obstruction.
3. Right ureteral stent noted in expected position, without evidence
for hydronephrosis. Suggestion of few small nonobstructing left
renal stones, with a clump measuring up to 6 mm in size.
4. Scattered bilateral parenchymal renal calcification and scarring
noted, with mild bilateral renal atrophy.
5. Small hiatal hernia seen.
6. Scattered coronary artery calcifications seen.
7. Mild degenerative change noted along the lumbar spine.
8. Small to moderate periumbilical hernia, containing only fat, with
minimal associated soft tissue inflammation.

## 2015-06-17 IMAGING — CT CT HEAD WITHOUT CONTRAST
1 series · 15 of 30 positions shown, 19 images · non-contrast
Comparison: CT of the head May 20, 2010

CLINICAL DATA: Confusion, agitation.

EXAM:
CT HEAD WITHOUT CONTRAST
TECHNIQUE: Contiguous axial images were obtained from the base of the skull
through the vertex without intravenous contrast.

[Series 2: head wo · axial · 0.40mm/px · z∈[-52,+74]mm · 15 of 32 slices shown, 19 images]
[im 2/32  brain]
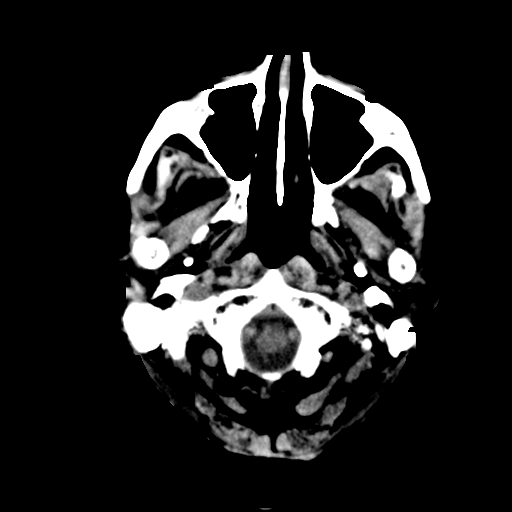
[im 2/32  bone]
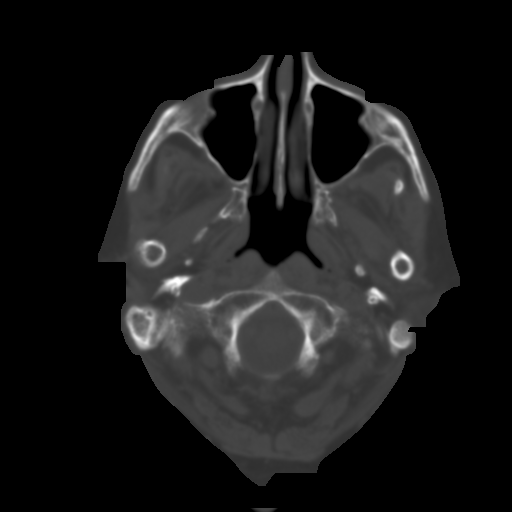
[im 4/32  brain]
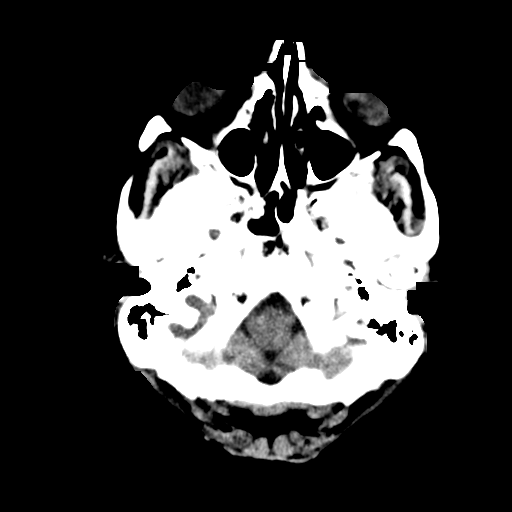
[im 6/32  brain]
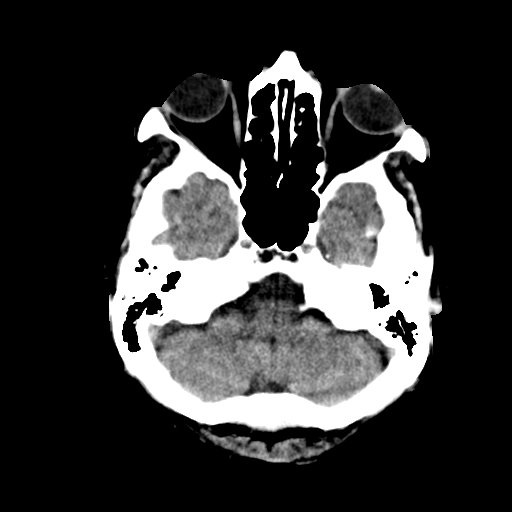
[im 8/32  brain]
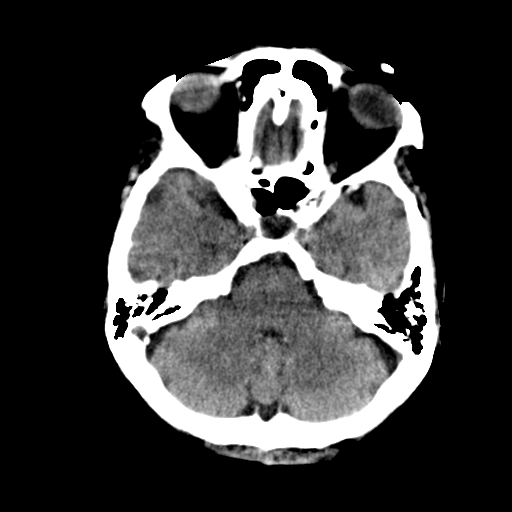
[im 10/32  brain]
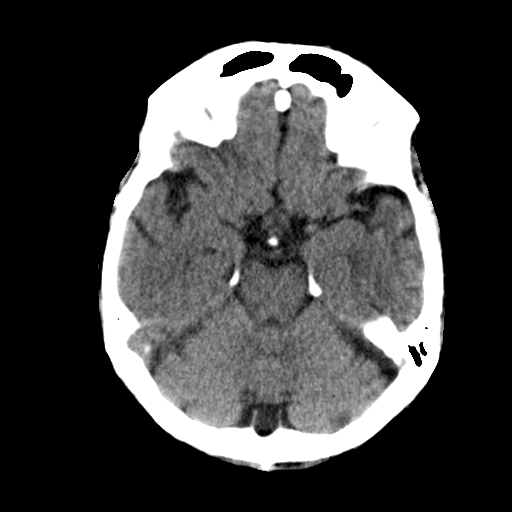
[im 10/32  bone]
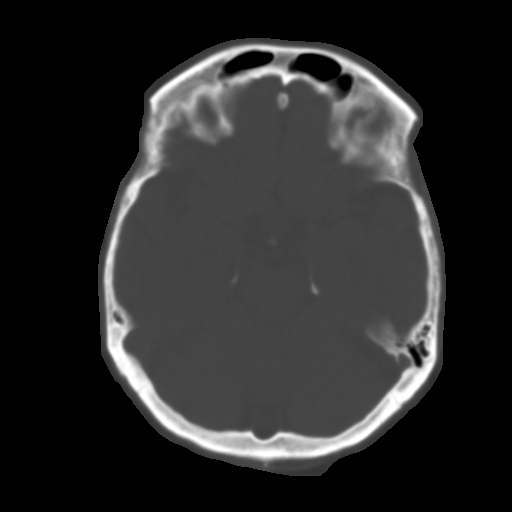
[im 12/32  brain]
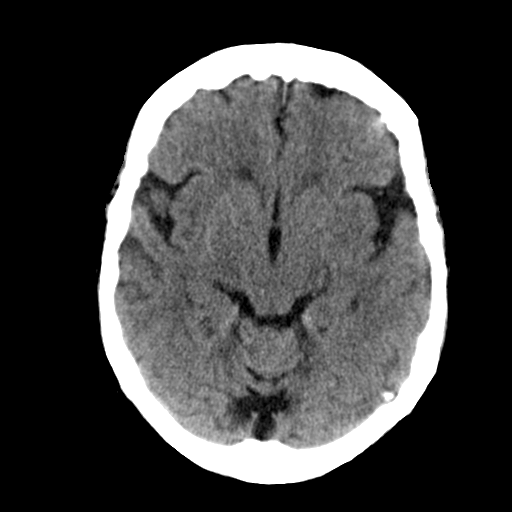
[im 14/32  brain]
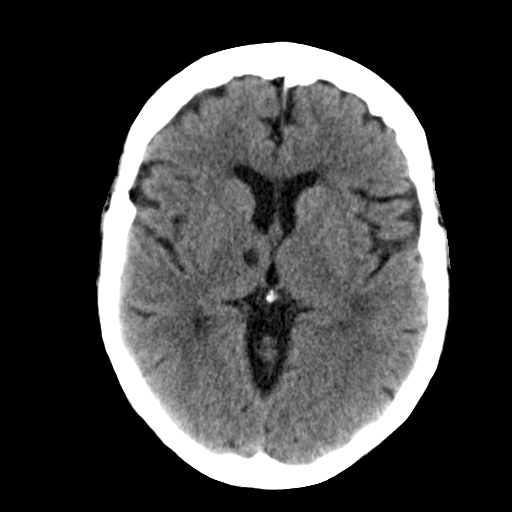
[im 17/32  brain]
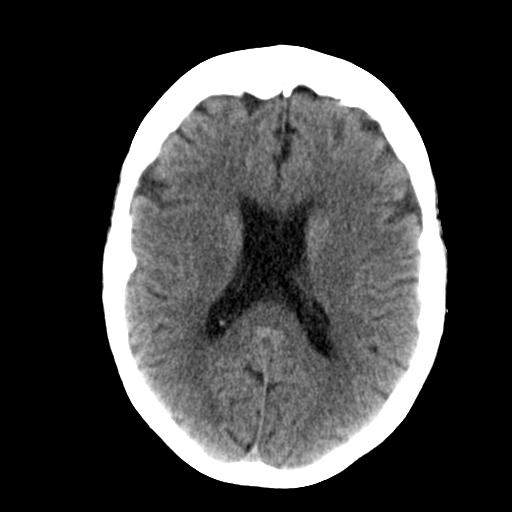
[im 18/32  brain]
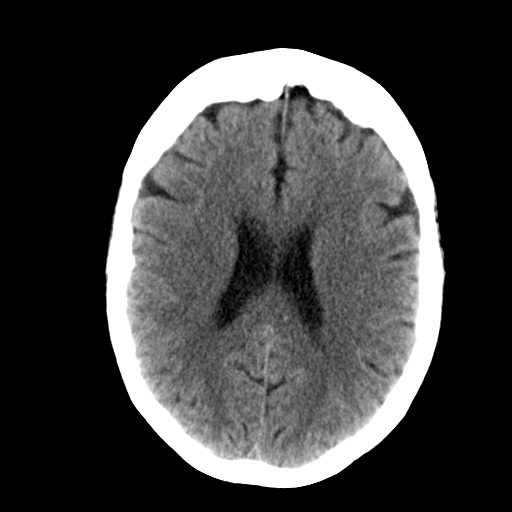
[im 18/32  bone]
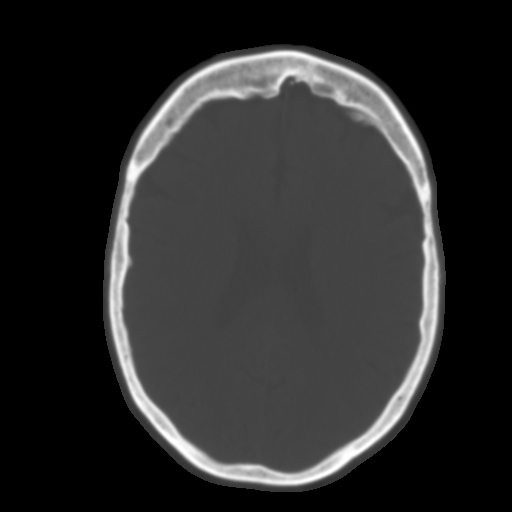
[im 20/32  brain]
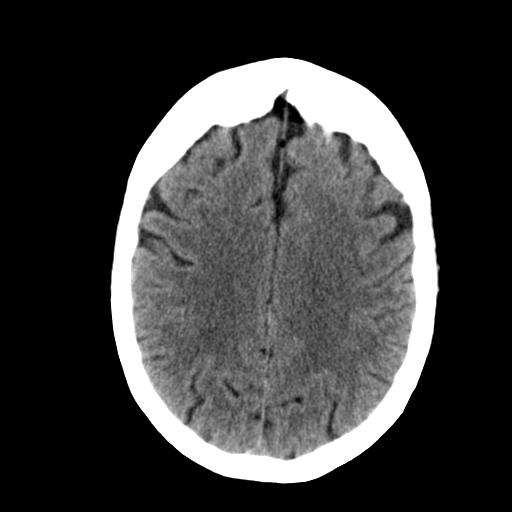
[im 22/32  brain]
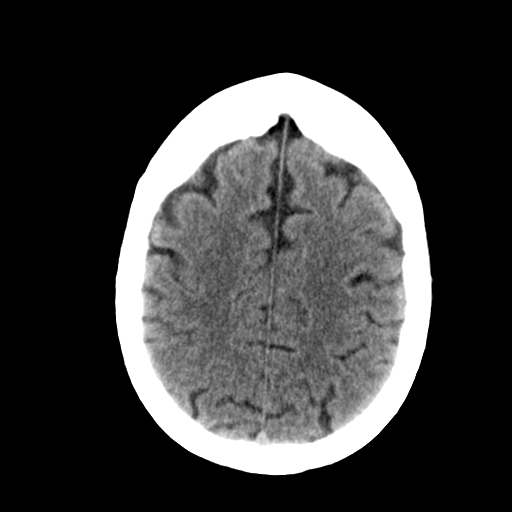
[im 24/32  brain]
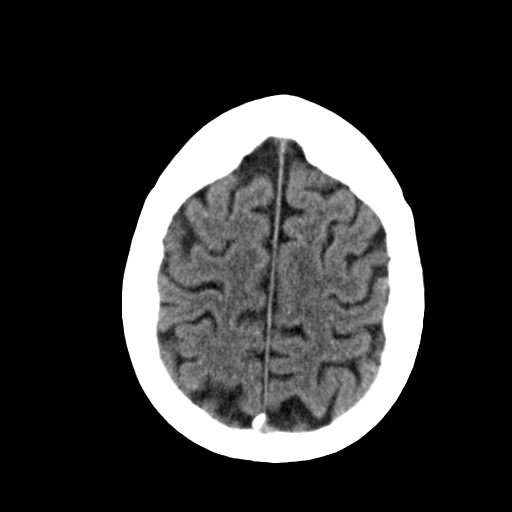
[im 26/32  brain]
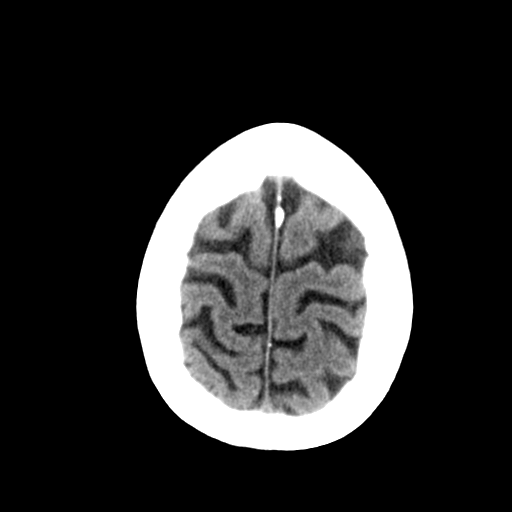
[im 26/32  bone]
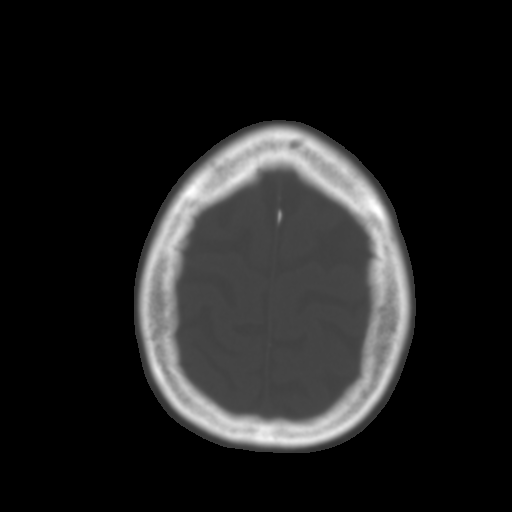
[im 28/32  brain]
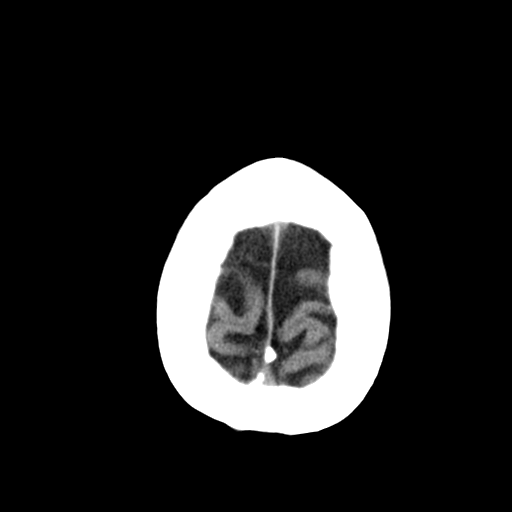
[im 30/32  brain]
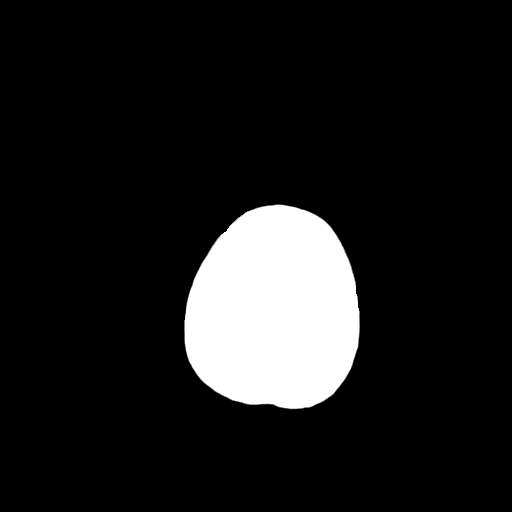

[15 of 30 positions shown; findings below may reference images not displayed]

FINDINGS: The ventricles and sulci are normal for age. No intraparenchymal
hemorrhage, mass effect nor midline shift. Patchy supratentorial
white matter hypodensities are less than expected range for
patient's age and though non-specific suggest sequelae of chronic
small vessel ischemic disease. No acute large vascular territory
infarcts. 8 mm hypodensity in right mesial thalamus is new.

No abnormal extra-axial fluid collections. Basal cisterns are
patent. Moderate calcific atherosclerosis of the carotid siphons.

No skull fracture. Stable probable bone island in left sphenoid
body. Similar left supraorbital scalp calcification. The included
ocular globes and orbital contents are non-suspicious. The mastoid
aircells and included paranasal sinuses are well-aerated. Severe
temporomandibular osteoarthrosis.
IMPRESSION: Age indeterminate sub cm right thalamus lacunar infarct.

Involutional changes. Mild white matter changes, less than expected
for age suggest chronic small vessel ischemic disease.

  By: Qomandan Tiger

## 2016-07-01 DEATH — deceased
